# Patient Record
Sex: Female | Born: 1935 | Race: Black or African American | Hispanic: No | Smoking: Never smoker
Health system: Southern US, Community
[De-identification: ages and names within clinical notes are randomized; demographics above are authoritative.]

## PROBLEM LIST (undated history)

## (undated) DIAGNOSIS — I4891 Unspecified atrial fibrillation: Secondary | ICD-10-CM

## (undated) DIAGNOSIS — N186 End stage renal disease: Secondary | ICD-10-CM

## (undated) DIAGNOSIS — I10 Essential (primary) hypertension: Secondary | ICD-10-CM

## (undated) DIAGNOSIS — N289 Disorder of kidney and ureter, unspecified: Secondary | ICD-10-CM

## (undated) DIAGNOSIS — C801 Malignant (primary) neoplasm, unspecified: Secondary | ICD-10-CM

## (undated) DIAGNOSIS — Z992 Dependence on renal dialysis: Secondary | ICD-10-CM

## (undated) DIAGNOSIS — R7881 Bacteremia: Secondary | ICD-10-CM

## (undated) DIAGNOSIS — R410 Disorientation, unspecified: Secondary | ICD-10-CM

## (undated) DIAGNOSIS — B955 Unspecified streptococcus as the cause of diseases classified elsewhere: Secondary | ICD-10-CM

## (undated) HISTORY — PX: CARDIAC CATHETERIZATION: SHX172

## (undated) HISTORY — PX: COLON SURGERY: SHX602

## (undated) HISTORY — DX: End stage renal disease: N18.6

## (undated) HISTORY — DX: Bacteremia: R78.81

## (undated) HISTORY — DX: Unspecified streptococcus as the cause of diseases classified elsewhere: B95.5

## (undated) HISTORY — DX: Dependence on renal dialysis: Z99.2

## (undated) HISTORY — DX: Disorientation, unspecified: R41.0

---

## 1978-10-12 HISTORY — PX: HAND SURGERY: SHX662

## 2011-03-14 HISTORY — PX: HEMATOMA EVACUATION: SHX5118

## 2011-04-09 ENCOUNTER — Encounter: Payer: Self-pay | Admitting: Cardiovascular Disease

## 2011-04-22 ENCOUNTER — Encounter (HOSPITAL_BASED_OUTPATIENT_CLINIC_OR_DEPARTMENT_OTHER): Payer: Medicare Other | Attending: General Surgery

## 2011-04-22 ENCOUNTER — Ambulatory Visit
Admission: RE | Admit: 2011-04-22 | Discharge: 2011-04-22 | Disposition: A | Discharge status: Home / Self Care | Payer: Medicare Other | Source: Ambulatory Visit | Attending: Nephrology | Admitting: Nephrology

## 2011-04-22 ENCOUNTER — Other Ambulatory Visit: Payer: Self-pay | Admitting: Nephrology

## 2011-04-22 DIAGNOSIS — M7989 Other specified soft tissue disorders: Secondary | ICD-10-CM | POA: Insufficient documentation

## 2011-04-22 DIAGNOSIS — R0902 Hypoxemia: Secondary | ICD-10-CM

## 2011-04-22 DIAGNOSIS — R0602 Shortness of breath: Secondary | ICD-10-CM

## 2011-04-22 DIAGNOSIS — M109 Gout, unspecified: Secondary | ICD-10-CM | POA: Insufficient documentation

## 2011-04-22 DIAGNOSIS — I10 Essential (primary) hypertension: Secondary | ICD-10-CM | POA: Insufficient documentation

## 2011-04-22 DIAGNOSIS — L989 Disorder of the skin and subcutaneous tissue, unspecified: Secondary | ICD-10-CM | POA: Insufficient documentation

## 2011-04-22 DIAGNOSIS — E119 Type 2 diabetes mellitus without complications: Secondary | ICD-10-CM | POA: Insufficient documentation

## 2011-04-22 DIAGNOSIS — Z7901 Long term (current) use of anticoagulants: Secondary | ICD-10-CM | POA: Insufficient documentation

## 2011-04-22 DIAGNOSIS — Z79899 Other long term (current) drug therapy: Secondary | ICD-10-CM | POA: Insufficient documentation

## 2011-04-22 DIAGNOSIS — I129 Hypertensive chronic kidney disease with stage 1 through stage 4 chronic kidney disease, or unspecified chronic kidney disease: Secondary | ICD-10-CM | POA: Insufficient documentation

## 2011-04-22 DIAGNOSIS — I4891 Unspecified atrial fibrillation: Secondary | ICD-10-CM | POA: Insufficient documentation

## 2011-04-22 DIAGNOSIS — N189 Chronic kidney disease, unspecified: Secondary | ICD-10-CM | POA: Insufficient documentation

## 2011-04-22 LAB — GLUCOSE, CAPILLARY: Glucose-Capillary: 198 mg/dL — ABNORMAL HIGH (ref 70–99)

## 2011-04-23 ENCOUNTER — Encounter (HOSPITAL_COMMUNITY): Payer: Self-pay | Admitting: Cardiology

## 2011-04-23 ENCOUNTER — Other Ambulatory Visit: Payer: Self-pay

## 2011-04-23 ENCOUNTER — Inpatient Hospital Stay (HOSPITAL_COMMUNITY)
Admission: EM | Admit: 2011-04-23 | Discharge: 2011-04-27 | DRG: 871 | Disposition: A | Discharge status: Home Health | Payer: Medicare Other | Attending: Family Medicine | Admitting: Family Medicine

## 2011-04-23 ENCOUNTER — Emergency Department (HOSPITAL_COMMUNITY): Payer: Medicare Other

## 2011-04-23 DIAGNOSIS — I4891 Unspecified atrial fibrillation: Secondary | ICD-10-CM | POA: Diagnosis present

## 2011-04-23 DIAGNOSIS — A419 Sepsis, unspecified organism: Secondary | ICD-10-CM | POA: Diagnosis present

## 2011-04-23 DIAGNOSIS — E8809 Other disorders of plasma-protein metabolism, not elsewhere classified: Secondary | ICD-10-CM | POA: Diagnosis present

## 2011-04-23 DIAGNOSIS — N186 End stage renal disease: Secondary | ICD-10-CM | POA: Diagnosis present

## 2011-04-23 DIAGNOSIS — I10 Essential (primary) hypertension: Secondary | ICD-10-CM | POA: Diagnosis present

## 2011-04-23 DIAGNOSIS — I120 Hypertensive chronic kidney disease with stage 5 chronic kidney disease or end stage renal disease: Secondary | ICD-10-CM | POA: Diagnosis present

## 2011-04-23 DIAGNOSIS — E119 Type 2 diabetes mellitus without complications: Secondary | ICD-10-CM | POA: Diagnosis present

## 2011-04-23 DIAGNOSIS — Z7901 Long term (current) use of anticoagulants: Secondary | ICD-10-CM

## 2011-04-23 DIAGNOSIS — S81801A Unspecified open wound, right lower leg, initial encounter: Secondary | ICD-10-CM | POA: Diagnosis present

## 2011-04-23 DIAGNOSIS — R7881 Bacteremia: Secondary | ICD-10-CM

## 2011-04-23 DIAGNOSIS — Z66 Do not resuscitate: Secondary | ICD-10-CM | POA: Diagnosis present

## 2011-04-23 DIAGNOSIS — N039 Chronic nephritic syndrome with unspecified morphologic changes: Secondary | ICD-10-CM | POA: Diagnosis present

## 2011-04-23 DIAGNOSIS — F0390 Unspecified dementia without behavioral disturbance: Secondary | ICD-10-CM | POA: Diagnosis present

## 2011-04-23 DIAGNOSIS — I079 Rheumatic tricuspid valve disease, unspecified: Secondary | ICD-10-CM | POA: Diagnosis present

## 2011-04-23 DIAGNOSIS — A409 Streptococcal sepsis, unspecified: Principal | ICD-10-CM | POA: Diagnosis present

## 2011-04-23 DIAGNOSIS — Z888 Allergy status to other drugs, medicaments and biological substances status: Secondary | ICD-10-CM

## 2011-04-23 DIAGNOSIS — Z992 Dependence on renal dialysis: Secondary | ICD-10-CM

## 2011-04-23 DIAGNOSIS — E871 Hypo-osmolality and hyponatremia: Secondary | ICD-10-CM | POA: Diagnosis present

## 2011-04-23 DIAGNOSIS — L02419 Cutaneous abscess of limb, unspecified: Secondary | ICD-10-CM | POA: Diagnosis present

## 2011-04-23 DIAGNOSIS — Z79899 Other long term (current) drug therapy: Secondary | ICD-10-CM

## 2011-04-23 DIAGNOSIS — B955 Unspecified streptococcus as the cause of diseases classified elsewhere: Secondary | ICD-10-CM | POA: Diagnosis present

## 2011-04-23 DIAGNOSIS — D631 Anemia in chronic kidney disease: Secondary | ICD-10-CM | POA: Diagnosis present

## 2011-04-23 DIAGNOSIS — R4182 Altered mental status, unspecified: Secondary | ICD-10-CM

## 2011-04-23 DIAGNOSIS — I071 Rheumatic tricuspid insufficiency: Secondary | ICD-10-CM | POA: Diagnosis present

## 2011-04-23 DIAGNOSIS — D696 Thrombocytopenia, unspecified: Secondary | ICD-10-CM | POA: Diagnosis present

## 2011-04-23 HISTORY — DX: Malignant (primary) neoplasm, unspecified: C80.1

## 2011-04-23 HISTORY — DX: Unspecified atrial fibrillation: I48.91

## 2011-04-23 HISTORY — DX: Disorder of kidney and ureter, unspecified: N28.9

## 2011-04-23 HISTORY — DX: Essential (primary) hypertension: I10

## 2011-04-23 LAB — DIFFERENTIAL
Basophils Absolute: 0 10*3/uL (ref 0.0–0.1)
Eosinophils Relative: 0 % (ref 0–5)
Lymphocytes Relative: 6 % — ABNORMAL LOW (ref 12–46)
Monocytes Relative: 8 % (ref 3–12)
Neutro Abs: 9.6 10*3/uL — ABNORMAL HIGH (ref 1.7–7.7)

## 2011-04-23 LAB — URINE MICROSCOPIC-ADD ON

## 2011-04-23 LAB — CBC
HCT: 32.9 % — ABNORMAL LOW (ref 36.0–46.0)
RBC: 4.39 MIL/uL (ref 3.87–5.11)
RDW: 18.2 % — ABNORMAL HIGH (ref 11.5–15.5)
WBC: 11.2 10*3/uL — ABNORMAL HIGH (ref 4.0–10.5)

## 2011-04-23 LAB — COMPREHENSIVE METABOLIC PANEL
ALT: 22 U/L (ref 0–35)
Albumin: 2.9 g/dL — ABNORMAL LOW (ref 3.5–5.2)
Alkaline Phosphatase: 70 U/L (ref 39–117)
Chloride: 95 mEq/L — ABNORMAL LOW (ref 96–112)
Glucose, Bld: 116 mg/dL — ABNORMAL HIGH (ref 70–99)
Potassium: 5.5 mEq/L — ABNORMAL HIGH (ref 3.5–5.1)
Sodium: 133 mEq/L — ABNORMAL LOW (ref 135–145)
Total Protein: 8.9 g/dL — ABNORMAL HIGH (ref 6.0–8.3)

## 2011-04-23 LAB — URINALYSIS, ROUTINE W REFLEX MICROSCOPIC
Glucose, UA: NEGATIVE mg/dL
Hgb urine dipstick: NEGATIVE
Specific Gravity, Urine: 1.016 (ref 1.005–1.030)

## 2011-04-23 LAB — CARDIAC PANEL(CRET KIN+CKTOT+MB+TROPI)
Relative Index: INVALID (ref 0.0–2.5)
Total CK: 34 U/L (ref 7–177)

## 2011-04-23 MED ORDER — SODIUM CHLORIDE 0.9 % IV BOLUS (SEPSIS)
150.0000 mL | Freq: Once | INTRAVENOUS | Status: AC
  Administered 2011-04-23: 150 mL via INTRAVENOUS

## 2011-04-23 MED ORDER — METOPROLOL TARTRATE 1 MG/ML IV SOLN
INTRAVENOUS | Status: AC
  Administered 2011-04-23: 5 mg
  Filled 2011-04-23: qty 5

## 2011-04-23 MED ORDER — ACETAMINOPHEN 650 MG RE SUPP
650.0000 mg | Freq: Once | RECTAL | Status: AC
  Administered 2011-04-23: 650 mg via RECTAL
  Filled 2011-04-23: qty 1

## 2011-04-23 MED ORDER — PIPERACILLIN-TAZOBACTAM IN DEX 2-0.25 GM/50ML IV SOLN
2.2500 g | INTRAVENOUS | Status: AC
  Administered 2011-04-23: 2.25 g via INTRAVENOUS
  Filled 2011-04-23: qty 50

## 2011-04-23 MED ORDER — VANCOMYCIN HCL 10 G IV SOLR
1.0000 g | INTRAVENOUS | Status: DC
Start: 2011-04-23 — End: 2011-04-23
  Filled 2011-04-23: qty 1000

## 2011-04-23 MED ORDER — SODIUM CHLORIDE 0.9 % IV SOLN
INTRAVENOUS | Status: DC
  Administered 2011-04-23 – 2011-04-24 (×2): via INTRAVENOUS

## 2011-04-23 MED ORDER — VANCOMYCIN HCL IN DEXTROSE 1-5 GM/200ML-% IV SOLN
1000.0000 mg | INTRAVENOUS | Status: AC
  Administered 2011-04-23: 1000 mg via INTRAVENOUS
  Filled 2011-04-23: qty 200

## 2011-04-23 MED ORDER — SODIUM CHLORIDE 0.9 % IV BOLUS (SEPSIS)
250.0000 mL | Freq: Once | INTRAVENOUS | Status: AC
  Administered 2011-04-23: 250 mL via INTRAVENOUS

## 2011-04-23 NOTE — ED Notes (Addendum)
Family stated that pt was at dialysis when she begin to have Altered LOC. According to family she was fully alert and oriented prior to dialysis. Currently, pt is not in any respiratory distress. Will continue to monitor.

## 2011-04-23 NOTE — ED Notes (Signed)
Nursing Communication: [phone] Eldridge Dace)

## 2011-04-23 NOTE — ED Notes (Signed)
Pt to department via EMS from dialysis center. Reports that she was able to sit through 2 hours of dialysis and became altered and tachycardiac. Pt was left accessed and transported here via EMS. Pt given 5mg  lopressor in route, pt noted to still be tachycardiac at this time. Pt unable to answer questions or follow simple commands. Pt confused and slightly combative. PEERLA intact. Pt noted to be febrile when checked. Pt also has a wound vac to the right leg that is in place and functioning. Dr. Golda Acre at the bedside to perform EJ for pt access. Fluids started and pt placed on cardiac monitor.

## 2011-04-23 NOTE — ED Notes (Signed)
Pt to department via EMS from dialysis treatment- reports that while pt was receiving dialysis today that she increased her HR to 150 and became altered during the treatment. Pt noted to still be in A-fib with RVR. 5mg  lopressor given in route. Dialysis shunt still accessed at this time. Bp-160/90 palp.

## 2011-04-23 NOTE — ED Provider Notes (Addendum)
History     CSN: [id number]  Arrival date & time 04/23/11  1528   First MD Initiated Contact with Patient 04/23/11 1537      Chief Complaint  Patient presents with  . Chest Pain    (Consider location/radiation/quality/duration/timing/severity/associated sxs/prior treatment) HPI Comments: Patient presents today from dialysis with altered mental status.  There is a level V caveat because patient is altered and unable to give further history at this time.  Per EMS patient was at dialysis for approximately 2 and half hours and then became altered and was noted to have an elevated heart rate.  Per her medical records patient does have a history of atrial fibrillation and is on Coumadin.  She was transported here via EMS and EMS had received a verbal order per someone else to give 5 mg of Lopressor for her A. fib.  Patient is a 78 y.o. female presenting with chest pain. The history is provided by the EMS personnel. No language interpreter was used.  Chest Pain The chest pain began less than 1 hour ago. Chest pain occurs constantly. The chest pain is unchanged.     Past Medical History  Diagnosis Date  . Renal disorder   . Atrial fibrillation   . Hypertension   . Diabetes mellitus   . Gout   . Cancer     colon    Past Surgical History  Procedure Date  . Cardiac catheterization     History reviewed. No pertinent family history.  History  Substance Use Topics  . Smoking status: Not on file  . Smokeless tobacco: Not on file  . Alcohol Use:     OB History    Grav Para Term Preterm Abortions TAB SAB Ect Mult Living                  Review of Systems  Unable to perform ROS Cardiovascular: Positive for chest pain.    Allergies  Avandia; Clonidine derivatives; Hctz; Lactose intolerance (gi); Lisinopril; and Tekturna  Home Medications   Current Outpatient Rx  Name Route Sig Dispense Refill  . DILTIAZEM HCL ER COATED BEADS 240 MG PO CP24 Oral Take 240 mg by mouth at  bedtime.    Marland Kitchen HYDRALAZINE HCL 25 MG PO TABS Oral Take 25 mg by mouth 3 (three) times daily. Hold morning dose on HD days    . ADULT MULTIVITAMIN W/MINERALS CH Oral Take 1 tablet by mouth daily.    . NEBIVOLOL HCL 20 MG PO TABS Oral Take 1 tablet by mouth at bedtime.    Marland Kitchen PROMETHAZINE HCL 12.5 MG PO TABS Oral Take 12.5 mg by mouth every 12 (twelve) hours as needed. For nausea    . TRAMADOL HCL 50 MG PO TABS Oral Take 50 mg by mouth every 6 (six) hours as needed. For pain    . WARFARIN SODIUM 2.5 MG PO TABS Oral Take 2.5 mg by mouth every Monday, Wednesday, and Friday.    . WARFARIN SODIUM 4 MG PO TABS Oral Take 4 mg by mouth See admin instructions. Take 4 mg on Sunday, Tuesday, Thursday, and Saturday      BP 153/91  Pulse 136  Temp(Src) 102.9 F (39.4 C) (Axillary)  Resp 20  SpO2 100%  Physical Exam  Nursing note and vitals reviewed. Constitutional: She appears well-developed and well-nourished.  Non-toxic appearance. She does not have a sickly appearance.  HENT:  Head: Normocephalic and atraumatic.  Eyes: Conjunctivae, EOM and lids are normal. Pupils are  equal, round, and reactive to light. No scleral icterus.  Neck: Trachea normal and normal range of motion. Neck supple.  Cardiovascular: Normal heart sounds.  An irregularly irregular rhythm present. Tachycardia present.  Exam reveals no gallop and no friction rub.   No murmur heard. Pulmonary/Chest: Effort normal and breath sounds normal. No respiratory distress. She has no wheezes. She has no rales.  Abdominal: Soft. Normal appearance. There is no tenderness. There is no rebound, no guarding and no CVA tenderness.  Musculoskeletal: Normal range of motion.       Patient's left AV fistula is still accessed and there is a palpable thrill.  No bleeding at the site.  Neurological: She is alert. She has normal strength.  Skin: Skin is warm, dry and intact. No rash noted.  Psychiatric:       Unable to assess due to patient's altered  mental status    ED Course  Angiocath insertion Date/Time: 04/23/2011 4:09 PM Performed by: Emeline General A Authorized by: Emeline General A Consent: Verbal consent not obtained. Written consent not obtained. The procedure was performed in an emergent situation. Preparation: Patient was prepped and draped in the usual sterile fashion. Local anesthesia used: no Patient sedated: no Patient tolerance: Patient tolerated the procedure well with no immediate complications. Comments: Patient had an 18-gauge right external jugular IV line placed by myself due to need for emergent IV access on this patient with abnormal vital signs.  Patient was altered and unable to give consent but the line was placed due to the emergency status.  Venous blood returned with ease that was nonpulsatile.  Patient tolerated procedure well.  No complications.   (including critical care time)   Labs Reviewed  CULTURE, BLOOD (ROUTINE X 2)  CULTURE, BLOOD (ROUTINE X 2)  CBC  DIFFERENTIAL  COMPREHENSIVE METABOLIC PANEL  LACTIC ACID, PLASMA  PROCALCITONIN  URINALYSIS, ROUTINE W REFLEX MICROSCOPIC  URINE CULTURE  PROTIME-INR  CARDIAC PANEL(CRET KIN+CKTOT+MB+TROPI)   Dg Chest 2 View  04/22/2011  *RADIOLOGY REPORT*  Clinical Data: History of recent pneumonia, cough, dialysis patient  CHEST - 2 VIEW  Comparison: None.  Findings: There is moderate cardiomegaly present.  Pericardial effusion cannot be excluded.  There do appear to be tiny pleural effusions present which may indicate a degree of failure. Mild volume loss is noted at the right lung base.  No focal infiltrate is seen. The bones are osteopenic and the descending thoracic aorta is ectatic.  IMPRESSION: Moderate cardiomegaly.  Cannot exclude pericardial effusion.  Also question mild CHF with probable small effusions.  Original Report Authenticated By: Juline Patch, M.D.     No diagnosis found.    Date: 04/23/2011  Rate: 141  Rhythm: atrial  fibrillation and with RVR  QRS Axis: left  Intervals: normal  ST/T Wave abnormalities: normal  Conduction Disutrbances:left anterior fascicular block  Narrative Interpretation:   Old EKG Reviewed: None available for comparison but pt has known h/o a fib    MDM  Patient appears to be febrile and there is concern for possible sepsis at this time.  Patient will have appropriate workup started to attempt to expedite any specific cause for treatment.  Patient does appear to be in A. fib with RVR on the monitor but as the patient is febrile and may be infected I am not going to give diltiazem at this point in time so we have more information.  Patient does have normal to hypertensive blood pressures at this time.  Nat Christen, MD 04/23/11 [phone]  Patient with fever and concern for infection.  Her chest x-ray is a poor chest x-ray due to patient's inability this straight up.  Given the patient also could have line sepsis and when to start the patient on vancomycin and Zosyn as she would be a healthcare associated infection due to being a hemodialysis patient.  Vancomycin and Zosyn would  also cover for possible respiratory pathology such as healthcare associated pneumonia.  Nat Christen, MD 04/23/11 1703  Pt still tachycardic but not hypotensive.  I have not treated the patient's A. fib with RVR as her tachycardia is likely related to her infection at this point in time.  I will continue to give the patient small boluses of fluids.  I discussed this patient with Dr. Felipa Eth from pulmonary critical care and given the patient's normal blood pressures feels the patient would be appropriate for a medical step down bed at this time particularly given the patient's normal lactate.  I will call the hospitalist to admit this patient to a medical step down level.  CRITICAL CARE Performed by: Emeline General A   Total critical care time: 45 minutes  Critical care time was exclusive of  separately billable procedures and treating other patients.  Critical care was necessary to treat or prevent imminent or life-threatening deterioration.  Critical care was time spent personally by me on the following activities: development of treatment plan with patient and/or surrogate as well as nursing, discussions with consultants, evaluation of patient's response to treatment, examination of patient, obtaining history from patient or surrogate, ordering and performing treatments and interventions, ordering and review of laboratory studies, ordering and review of radiographic studies, pulse oximetry and re-evaluation of patient's condition.   Nat Christen, MD 04/23/11 1903  Pt discussed with FP resident for admission to med step unit.   Nat Christen, MD 04/23/11 [phone]

## 2011-04-23 NOTE — ED Notes (Signed)
Pt has right leg wound vac from hematoma that occurred in January 2013

## 2011-04-23 NOTE — ED Notes (Signed)
Dopplers will not be completed until tomorrow. Currently, pt is awaiting a stepdown bed. No bed available at this time. Will continue to monitor.

## 2011-04-23 NOTE — Progress Notes (Signed)
ANTIBIOTIC CONSULT NOTE - FOLLOW UP  Pharmacy Consult for Vancomycin,Zosyn and Coumadin Indication: cellulitis-right leg and afib  Allergies  Allergen Reactions  . Avandia (Rosiglitazone Maleate)   . Clonidine Derivatives Other (See Comments)    unknown  . Hctz (Hydrochlorothiazide) Other (See Comments)    unknown  . Lactose Intolerance (Gi)   . Lisinopril Other (See Comments)    unknown  . Tekturna (Aliskiren Fumarate) Other (See Comments)    unknown    Patient Measurements: Height: 5\' 2"  (157.5 cm) Weight: 130 lb (58.968 kg) IBW/kg (Calculated) : 50.1    Vital Signs: Temp: 101.9 F (38.8 C) (03/13 1859) Temp src: Rectal (03/13 1859) BP: 128/72 mmHg (03/13 1930) Pulse Rate: 129  (03/13 1930) Intake/Output from previous day:   Intake/Output from this shift:    Labs:  Basename 04/23/11 1605  WBC 11.2*  HGB 11.4*  PLT 153  LABCREA --  CREATININE 2.68*   Estimated Creatinine Clearance: 11.7 ml/min (by C-G formula based on Cr of 2.68). No results found for this basename: VANCOTROUGH:2,VANCOPEAK:2,VANCORANDOM:2,GENTTROUGH:2,GENTPEAK:2,GENTRANDOM:2,TOBRATROUGH:2,TOBRAPEAK:2,TOBRARND:2,AMIKACINPEAK:2,AMIKACINTROU:2,AMIKACIN:2, in the last 72 hours   Microbiology: No results found for this or any previous visit (from the past 720 hour(s)).  Anti-infectives     Start     Dose/Rate Route Frequency Ordered Stop   04/23/11 1730   vancomycin (VANCOCIN) injection 1 g  Status:  Discontinued        1 g Intravenous To [facility] 04/23/11 1702 04/23/11 1720   04/23/11 1730  piperacillin-tazobactam (ZOSYN) IVPB 2.25 g       2.25 g 100 mL/hr over 30 Minutes Intravenous To [facility] 04/23/11 1702 04/23/11 1907   04/23/11 1730   vancomycin (VANCOCIN) IVPB 1000 mg/200 mL premix        1,000 mg 200 mL/hr over 60 Minutes Intravenous To [facility] 04/23/11 1720 04/23/11 1827          Assessment: 78 yr old female on Vancomycin and Zosyn for  cellulitis of the right leg.  Patient received Vancomycin 1gm IV and Zosyn 2.25gm IV in ED. Patient also on coumadin for afib, INR is 4.12, above goal.  Goal of Therapy:  Vancomycin trough level 10-15 mcg/ml INR=2-3  Plan:  Start Vancomycin 500mg  IV q48hrs-next dose on 3/15, and start Zosyn 2.25gm IV q6hrs. No coumadin tonight as INR above goal. Daily PT/INR. F/u cultures, renal func, daily PT/INR, vanc trough levels if applicable.  Wendie Simmer, PharmD, BCPS Clinical Pharmacist  Pager: [phone]    04/23/2011,8:31 PM

## 2011-04-23 NOTE — ED Notes (Signed)
Admitting MD at bedside.

## 2011-04-23 NOTE — Progress Notes (Signed)
[facility]  NAME:  Miranda Cook, Miranda Cook                ACCOUNT NO.:  [id number]  MEDICAL RECORD NO.:  [id number]      DATE OF BIRTH:  January 30, 1923  PHYSICIAN:  Joanne Gavel, M.D.              VISIT DATE:                                  OFFICE VISIT   CHIEF COMPLAINT:  Wound, right foreleg.  HISTORY OF PRESENT ILLNESS:  A 78 year old female diabetic on chronic hemodialysis for several years, fell several months ago, developed a sizable hematoma.  When the hematoma was cleared, the patient had a large wound on the right foreleg.  PAST MEDICAL HISTORY:  Significant for chronic renal failure on dialysis for 2 years, diabetes mellitus for many years, hypertension, gout, chronic atrial fibrillation.  PAST SURGICAL HISTORY:  Hemicolectomy, colon cancer, hemorrhoidectomy, cardiac cath, the results of which are not available, AV fistula, left upper extremity, and right leg wound.  SOCIAL HISTORY:  Cigarettes:  None.  Alcohol:  None.  MEDICATIONS:  Bystolic, Klor-Con, Dialyvite, Cardizem, Coumadin, Ultram, hydralazine, Sensipar.  ALLERGIES:  CLONIDINE, LISINOPRIL, HCTZ, AVANDIA, TEKTURNA, AND LACTOSE INTOLERANCE.  REVIEW OF SYSTEMS:  The patient has been treated with the [facility] quite successfully.  PHYSICAL EXAMINATION:  VITAL SIGNS:  Temperature 97.3, pulse 93 and irregular, respirations 18, blood pressure 100/63. GENERAL APPEARANCE:  Well-developed, slender, slightly confused elderly female, in no distress. CHEST:  Clear. HEART:  Irregularly irregular. ABDOMEN:  Not examined. EXTREMITIES:  Examination of right lower extremity reveals a 7.8 x 1.4 very superficial wound in the right anterior calf.  Peripheral pulses are palpable.  There is swelling of the foot and some tenderness.  The swelling is essentially bilateral and symmetrical.  IMPRESSION:  Traumatic wound in a patient with diabetes and on hemodialysis and appears to be healing very nicely at present.  PLAN  OF TREATMENT:  We will add collagen to the [facility] treatment.  We will see her in 7 days.     Joanne Gavel, M.D.     RA/MEDQ  D:  04/22/2011  T:  04/23/2011  Job:  [id number]

## 2011-04-23 NOTE — ED Notes (Signed)
Spoke with Consulting civil engineer and EDP. Both made aware that we are still waiting for a stepdown bed and may take awhile. Per Consulting civil engineer, pt will stay in current and not go to holding because there are not drs in the holding area. Will continue to monitor.

## 2011-04-23 NOTE — H&P (Signed)
Family Medicine Teaching [facility] Admission History and Physical  Patient name: Clella Mckeel Medical record number: [id number] Date of birth: [birth year] Age: 78 y.o. Gender: female  Primary Care Provider: Almedia Balls, MD, MD  Chief Complaint: Altered mental status History of Present Illness: Karlye Ihrig is a 78 y.o. female presenting with altered mental status that started last Friday. When after dialysis they informed to granddaughter that they took more fluid. She was fatigued at that point and seemed, nauseated took Promethazine and after that pt had episodes of watery diarrhea. Pt was slowly improving to her almost baseline on Tuesday and today had a rapid decline of her mental condition prior dialysis. Was taken for hemodialysis but after 2 hours was decided to send her to ED to evaluate her AMS and tachycardia. There has been sick contacts (family members) with stomach flu. On ED pt was tachycardic and febrile up to 103. She was never hypotensive.   Pt originally form [place] in dialysis for 2 years. Pt had a hx of a fall back in January with hematoma in Right leg that was drained ( at [facility]) and has been with wound vac for treatment since then. She moved recently (3 weeks ago) with her granddaughter. Goes to [facility] for dialysis, scheduled M//W/F. And has had just 1 appointment with a primary care doctor at [facility] more focused in her cardiac condition.  Past Medical History: Past Medical History  Diagnosis Date  . Renal disorder   . Atrial fibrillation   . Hypertension   . Diabetes mellitus   . Gout   . Cancer     colon   Past Surgical History: Past Surgical History  Procedure Date  . Cardiac catheterization        Colon polyps.   Social History: History   Social History  . Marital Status: Widowed    Spouse Name: N/A    Number of Children: N/A  . Years of Education: N/A   Social History Main Topics  . Smoking status: None  .  Smokeless tobacco: None  . Alcohol Use:   . Drug Use:   . Sexually Active:    Other Topics Concern  . None   Social History Narrative  . None   Family History: History reviewed. No pertinent family history. Allergies: Allergies  Allergen Reactions  . Avandia (Rosiglitazone Maleate)   . Clonidine Derivatives Other (See Comments)    unknown  . Hctz (Hydrochlorothiazide) Other (See Comments)    unknown  . Lactose Intolerance (Gi)   . Lisinopril Other (See Comments)    unknown  . Tekturna (Aliskiren Fumarate) Other (See Comments)    unknown   Current Facility-Administered Medications  Medication Dose Route Frequency Provider Last Rate Last Dose  . acetaminophen (TYLENOL) suppository 650 mg  650 mg Rectal Once Nat Christen, MD   650 mg at 04/23/11 1726  . metoprolol (LOPRESSOR) 1 MG/ML injection        5 mg at 04/23/11 1528  . piperacillin-tazobactam (ZOSYN) IVPB 2.25 g  2.25 g Intravenous To Major Nat Christen, MD   2.25 g at 04/23/11 1837  . sodium chloride 0.9 % bolus 250 mL  250 mL Intravenous Once Nat Christen, MD   250 mL at 04/23/11 1614  . sodium chloride 0.9 % bolus 250 mL  250 mL Intravenous Once Nat Christen, MD   250 mL at 04/23/11 1918  . vancomycin (VANCOCIN) IVPB 1000 mg/200 mL premix  1,000  mg Intravenous To Major Nat Christen, MD   1,000 mg at 04/23/11 1727  . DISCONTD: vancomycin (VANCOCIN) injection 1 g  1 g Intravenous To Major Nat Christen, MD       Current Outpatient Prescriptions  Medication Sig Dispense Refill  . diltiazem (CARDIZEM CD) 240 MG 24 hr capsule Take 240 mg by mouth at bedtime.      . hydrALAZINE (APRESOLINE) 25 MG tablet Take 25 mg by mouth 3 (three) times daily. Hold morning dose on HD days      . Multiple Vitamin (MULITIVITAMIN WITH MINERALS) TABS Take 1 tablet by mouth daily.      . Nebivolol HCl (BYSTOLIC) 20 MG TABS Take 1 tablet by mouth at bedtime.      . promethazine (PHENERGAN) 12.5 MG tablet Take 12.5  mg by mouth every 12 (twelve) hours as needed. For nausea      . traMADol (ULTRAM) 50 MG tablet Take 50 mg by mouth every 6 (six) hours as needed. For pain      . warfarin (COUMADIN) 2.5 MG tablet Take 2.5 mg by mouth every Monday, Wednesday, and Friday.      . warfarin (COUMADIN) 4 MG tablet Take 4 mg by mouth See admin instructions. Take 4 mg on Sunday, Tuesday, Thursday, and Saturday        Review Of Systems: Per HPI Review of Systems  Constitutional: Positive for fever. Negative for malaise/fatigue.  Eyes: Negative.   Respiratory: Negative for cough, sputum production, shortness of breath and wheezing.   Cardiovascular: Negative for chest pain, palpitations and orthopnea.  Gastrointestinal: Positive for nausea and diarrhea.  Genitourinary: Negative for dysuria.  Neurological: Positive for loss of consciousness. Negative for focal weakness, seizures and headaches.  Psychiatric/Behavioral:       Baseline mild dementia.    Physical Exam: Filed Vitals:   04/23/11 2033  BP: 128/72  Pulse: 129  Temp: 99 F (37.2 C)  Resp: 13   Gen:  NAD HEENT: mildly dry mucous membranes, no JVD CV: Tachycardic, systolic murmurs present.  No rubs. PULM: Clear to auscultation bilaterally. No wheezes/rales/rhonchi ABD: Soft, non tender, non distended, normal bowel sounds EXT: Right Knee with wound vac in place mild erythema above the knee and painful to palpation. Not visualized above the knee due to pt habitus. Brisk bilateral pedal pulses. 1+bilateral pitting edema of LE. Neuro: lethargic, responding only to name. No tremors. Pt does not cooperate for rest of  neurologic physical exam.  Labs and Imaging: Lab Results  Component Value Date/Time   NA 133* 04/23/2011  4:05 PM   K 5.5* 04/23/2011  4:05 PM   CL 95* 04/23/2011  4:05 PM   CO2 26 04/23/2011  4:05 PM   BUN 15 04/23/2011  4:05 PM   CREATININE 2.68* 04/23/2011  4:05 PM   GLUCOSE 116* 04/23/2011  4:05 PM   Lab Results  Component Value Date     WBC 11.2* 04/23/2011   HGB 11.4* 04/23/2011   HCT 32.9* 04/23/2011   MCV 74.9* 04/23/2011   PLT 153 04/23/2011    Assessment and Plan: Renal disorder   Atrial fibrillation   Hypertension   Diabetes mellitus   Gout   Cancer    colon   Rutha Melgoza is a 78 y.o.  female with Hx of Afib on coumadin and CKD on dialysis presenting with AMS since last Friday.  1. AMS: Lethargic, not CTA Chronic ischemic changes and atrophy. No acute intracranial pathology. No sure  the cause but in the differential it is highly considered infection. Pt is febrile, tachycardic, mildly elevation of WBC and the source has not been identified yet being the right leg wound, urine or GI  the most likely sources.  - UA and UCx, Blood Cx - Vanc and Zosyn. - Dopplers of LE.   2. Right Knee wound: - Wound consult  3. CKD: Cr 2.68, K 5.5 and CO2 26 -Slow hydration due to multiple competing cormobilities and  will consult with renal to maintain scheduled Dialysis.   3.Afib: On coumadin. INR: 4.12. Will hold coumadin for now. -continue Cardizem to a lower dose since at this point BP is important to monitor.   3. HTN: hold bistolic for now and monitor.  4. DM : SSI and A1C.  5. Dementia: Mild, not current treatment. Pt functional before this event.  FEN/GI: NS 50 ml/h per 10 hours. Prophylaxis: INR elevated.  Disposition: pending improvement.  D. [place] Rolene Arbour PGY-1 FMTS Pager [phone]     R3 Addendum Please see above for more thorough H&P note by intern In brief   Maciel Kegg is a 78 y.o. Female with Hx of DM, ESRD on HD, AFib on coumadin, dementia  With recent hospitalization for fall in January followed by a 2 week stay in inpt rehab in [place] who recently moved to [place] in the last 3 weeks to live with Granddaughter who presenting with altered mental status that started last Friday. Over the coarse of the weekend pt presented with diarrhea, fatigue and worsening AMS per POA  report.  Seemed to be improving on Tuesday but today was much worse. . Was taken for hemodialysis but after 2 hours was decided to send her to ED to evaluate her AMS and tachycardia. There has been sick contacts (family members) with stomach flu. On ED pt was tachycardic and febrile up to 103. She was never hypotensive.      Pt had a hx of a fall back in January with hematoma in Right leg that was drained ( at [facility]) and has been with wound vac for treatment since then. She moved recently (3 weeks ago) with her granddaughter. Goes to [facility] for dialysis, scheduled M//W/F. And has had just 1 appointment with a primary care doctor at [facility] more focused in her cardiac condition. Please see intern note for PmHx, PSH, SH, medications  Physical Exam: Filed Vitals:   04/23/11 2033  BP: 128/72  Pulse: 129  Temp: 99 F (37.2 C)  Resp: 13   Gen:  NAD HEENT: dry mucous membranes, no sores no JVD CV: Tachycardic, systolic murmurs present.  No rubs. Irregular rate and rythym PULM: Clear to auscultation bilaterally. No wheezes/rales/rhonchi ABD: Soft, non tender, non distended, normal bowel sounds EXT: Right Knee with wound vac in place just inferior to knee at the anterior lateral aspect,  erythema  and painful to palpation with trace to +{1 edema. . Not visualized above the knee due to pt habitus. Brisk bilateral pedal pulses.  Neuro: lethargic, responding only to name. No tremors. Pt does not cooperate for rest of  neurologic physical exam.  Yailin Biederman is a 78 y.o.  female with Hx of Afib on coumadin, DM, HTN and ESRD on dialysis presenting with AMS since last Friday.  1. AMS: Lethargic, does have dementia at baseline that family states has some trouble with seeing people who are not there and what sounds to be sundowning.Marland Kitchen No  acute intracranial pathology on CT. High on differential is infection. . Pt is febrile, tachycardic, mildly elevation of WBC and the source  has not been identified yet being the right leg wound,does seem likely source. .  - UA negative  and UCx, Blood Cx pending - Vanc and Zosyn. - Dopplers of LE rule out clot but unlikely  2. Right Knee wound: has wound vac, more of the tibial region.  - Wound consult  3. CKD: Cr 2.68, K 5.5 and CO2 26 -Slow hydration due to multiple competing cormobilities and  will consult with renal to maintain scheduled Dialysis.   3.Afib: On coumadin. INR: 4.12. Will hold coumadin for now. -continue Cardizem to a lower dose since at this point BP is important to monitor.  - ECHO to check for valve dx and if none consider change from coumadin  3. HTN: hold bistolic and hydralazine for now and monitor.  4. DM : SSI and A1C.  5. Dementia: Mild, not current treatment. Pt functional before this event.  FEN/GI: NS 50 ml/h per 10 hours then will see if pt able to tolerate PO. Prophylaxis: INR elevated.  Disposition: pending improvement.

## 2011-04-24 ENCOUNTER — Inpatient Hospital Stay (HOSPITAL_COMMUNITY): Payer: Medicare Other

## 2011-04-24 DIAGNOSIS — A419 Sepsis, unspecified organism: Secondary | ICD-10-CM

## 2011-04-24 DIAGNOSIS — L02419 Cutaneous abscess of limb, unspecified: Secondary | ICD-10-CM

## 2011-04-24 DIAGNOSIS — F05 Delirium due to known physiological condition: Secondary | ICD-10-CM

## 2011-04-24 DIAGNOSIS — L03119 Cellulitis of unspecified part of limb: Secondary | ICD-10-CM

## 2011-04-24 DIAGNOSIS — N186 End stage renal disease: Secondary | ICD-10-CM

## 2011-04-24 LAB — LIPID PANEL
Cholesterol: 99 mg/dL (ref 0–200)
Triglycerides: 92 mg/dL (ref <150)
VLDL: 18 mg/dL (ref 0–40)

## 2011-04-24 LAB — HEMOGLOBIN A1C
Hgb A1c MFr Bld: 5.5 % (ref <5.7)
Mean Plasma Glucose: 111 mg/dL (ref <117)

## 2011-04-24 LAB — GLUCOSE, CAPILLARY
Glucose-Capillary: 166 mg/dL — ABNORMAL HIGH (ref 70–99)
Glucose-Capillary: 202 mg/dL — ABNORMAL HIGH (ref 70–99)

## 2011-04-24 LAB — CBC
HCT: 29.9 % — ABNORMAL LOW (ref 36.0–46.0)
Hemoglobin: 9.9 g/dL — ABNORMAL LOW (ref 12.0–15.0)
MCH: 25.4 pg — ABNORMAL LOW (ref 26.0–34.0)
MCV: 76.9 fL — ABNORMAL LOW (ref 78.0–100.0)
RBC: 3.89 MIL/uL (ref 3.87–5.11)

## 2011-04-24 LAB — CARDIAC PANEL(CRET KIN+CKTOT+MB+TROPI)
CK, MB: 1 ng/mL (ref 0.3–4.0)
CK, MB: 1 ng/mL (ref 0.3–4.0)
Troponin I: <0.3 ng/mL (ref <0.30)
Troponin I: <0.3 ng/mL (ref <0.30)

## 2011-04-24 LAB — URINE CULTURE
Colony Count: NO GROWTH
Culture  Setup Time: 201303140222
Culture: NO GROWTH

## 2011-04-24 LAB — PROTIME-INR: Prothrombin Time: 46.4 seconds — ABNORMAL HIGH (ref 11.6–15.2)

## 2011-04-24 LAB — COMPREHENSIVE METABOLIC PANEL
ALT: 14 U/L (ref 0–35)
BUN: 20 mg/dL (ref 6–23)
CO2: 27 mEq/L (ref 19–32)
Calcium: 8.9 mg/dL (ref 8.4–10.5)
Creatinine, Ser: 3.55 mg/dL — ABNORMAL HIGH (ref 0.50–1.10)
GFR calc Af Amer: 12 mL/min — ABNORMAL LOW (ref >90)
GFR calc non Af Amer: 11 mL/min — ABNORMAL LOW (ref >90)
Glucose, Bld: 119 mg/dL — ABNORMAL HIGH (ref 70–99)
Sodium: 133 mEq/L — ABNORMAL LOW (ref 135–145)

## 2011-04-24 LAB — MAGNESIUM: Magnesium: 1.8 mg/dL (ref 1.5–2.5)

## 2011-04-24 MED ORDER — HEPARIN SODIUM (PORCINE) 5000 UNIT/ML IJ SOLN
5000.0000 [IU] | Freq: Three times a day (TID) | INTRAMUSCULAR | Status: DC
  Filled 2011-04-24 (×3): qty 1

## 2011-04-24 MED ORDER — DILTIAZEM HCL ER COATED BEADS 120 MG PO CP24
120.0000 mg | ORAL_CAPSULE | Freq: Every day | ORAL | Status: DC
  Filled 2011-04-24: qty 1

## 2011-04-24 MED ORDER — PIPERACILLIN-TAZOBACTAM IN DEX 2-0.25 GM/50ML IV SOLN
2.2500 g | Freq: Three times a day (TID) | INTRAVENOUS | Status: DC
  Administered 2011-04-24 – 2011-04-25 (×4): 2.25 g via INTRAVENOUS
  Filled 2011-04-24 (×6): qty 50

## 2011-04-24 MED ORDER — DILTIAZEM HCL 30 MG PO TABS
30.0000 mg | ORAL_TABLET | Freq: Four times a day (QID) | ORAL | Status: DC
  Administered 2011-04-24 – 2011-04-27 (×10): 30 mg via ORAL
  Filled 2011-04-24 (×14): qty 1

## 2011-04-24 MED ORDER — SODIUM CHLORIDE 0.9 % IV SOLN
750.0000 mg | INTRAVENOUS | Status: DC
  Filled 2011-04-24: qty 750

## 2011-04-24 MED ORDER — ADULT MULTIVITAMIN W/MINERALS CH
1.0000 | ORAL_TABLET | Freq: Every day | ORAL | Status: DC
  Administered 2011-04-24 – 2011-04-27 (×4): 1 via ORAL
  Filled 2011-04-24 (×4): qty 1

## 2011-04-24 MED ORDER — ACETAMINOPHEN 650 MG RE SUPP: 650.0000 mg | Freq: Four times a day (QID) | RECTAL | Status: DC | PRN

## 2011-04-24 MED ORDER — CINACALCET HCL 30 MG PO TABS
30.0000 mg | ORAL_TABLET | ORAL | Status: DC
  Administered 2011-04-25: 30 mg via ORAL
  Filled 2011-04-24 (×2): qty 1

## 2011-04-24 MED ORDER — ACETAMINOPHEN 325 MG PO TABS
650.0000 mg | ORAL_TABLET | Freq: Four times a day (QID) | ORAL | Status: DC | PRN
  Administered 2011-04-25 – 2011-04-27 (×2): 650 mg via ORAL
  Filled 2011-04-24: qty 2

## 2011-04-24 MED ORDER — DILTIAZEM HCL 30 MG PO TABS: 30.0000 mg | ORAL_TABLET | Freq: Four times a day (QID) | ORAL | Status: DC

## 2011-04-24 MED ORDER — ONDANSETRON HCL 4 MG/2ML IJ SOLN: 4.0000 mg | Freq: Four times a day (QID) | INTRAMUSCULAR | Status: DC | PRN

## 2011-04-24 MED ORDER — PANTOPRAZOLE SODIUM 40 MG IV SOLR
40.0000 mg | Freq: Every day | INTRAVENOUS | Status: DC
  Administered 2011-04-24 – 2011-04-26 (×2): 40 mg via INTRAVENOUS
  Filled 2011-04-24 (×3): qty 40

## 2011-04-24 MED ORDER — PARICALCITOL 5 MCG/ML IV SOLN
3.0000 ug | INTRAVENOUS | Status: DC
  Administered 2011-04-25: 3 ug via INTRAVENOUS
  Filled 2011-04-24 (×2): qty 0.6

## 2011-04-24 MED ORDER — ADULT MULTIVITAMIN W/MINERALS CH: 1.0000 | ORAL_TABLET | Freq: Every day | ORAL | Status: DC

## 2011-04-24 MED ORDER — PIPERACILLIN-TAZOBACTAM IN DEX 2-0.25 GM/50ML IV SOLN
2.2500 g | Freq: Four times a day (QID) | INTRAVENOUS | Status: DC
  Administered 2011-04-24: 2.25 g via INTRAVENOUS
  Filled 2011-04-24 (×3): qty 50

## 2011-04-24 MED ORDER — ONDANSETRON HCL 4 MG PO TABS: 4.0000 mg | ORAL_TABLET | Freq: Four times a day (QID) | ORAL | Status: DC | PRN

## 2011-04-24 MED ORDER — WARFARIN - PHARMACIST DOSING INPATIENT: Freq: Every day | Status: DC

## 2011-04-24 MED ORDER — DARBEPOETIN ALFA-POLYSORBATE 60 MCG/0.3ML IJ SOLN
60.0000 ug | INTRAMUSCULAR | Status: DC
  Administered 2011-04-25: 60 ug via INTRAVENOUS
  Filled 2011-04-24: qty 0.3

## 2011-04-24 MED ORDER — VANCOMYCIN HCL 500 MG IV SOLR: 500.0000 mg | INTRAVENOUS | Status: DC

## 2011-04-24 NOTE — Progress Notes (Signed)
Per Darl Pikes in lab Blood cultures aerobic/ anerobic drawn on 3/13 and 2nd aerobic bottle drawn on 3/13 all bottles growing the same thing gram positive cocci in pairs and chains. Dr. Madolyn Frieze. Made aware.

## 2011-04-24 NOTE — Progress Notes (Signed)
Patient's daughter is present and has been able to provide answers to admission questions.  The daughter states that the patient had a fall near the end of January which resulted in need for surgery on right leg due to a hematoma that formed after the fall.  Then the patient was sent to [facility] in [place] for rehab.  The patient then moved in with her daughter in [place]. The daughter states that a home health aide comes to the home multiple times per week to assist the patient with activities of daily living and PT works with the patient on MWF, OT works with the patient on TR.   Timmie Foerster, RN

## 2011-04-24 NOTE — Progress Notes (Signed)
ANTIBIOTIC CONSULT NOTE - FOLLOW UP  Pharmacy Consult for Vancomycin, Zosyn, and Coumadin Indication: cellulitis-right leg and afib  Allergies  Allergen Reactions  . Avandia (Rosiglitazone Maleate)   . Clonidine Derivatives Other (See Comments)    unknown  . Hctz (Hydrochlorothiazide) Other (See Comments)    unknown  . Lactose Intolerance (Gi)   . Lisinopril Other (See Comments)    unknown  . Tekturna (Aliskiren Fumarate) Other (See Comments)    unknown    Patient Measurements: Height: 5\' 3"  (160 cm) Weight: 137 lb 2 oz (62.2 kg) IBW/kg (Calculated) : 52.4    Vital Signs: Temp: 98.2 F (36.8 C) (03/14 0756) Temp src: Axillary (03/14 0756) BP: 99/62 mmHg (03/14 0756) Pulse Rate: 109  (03/14 0756) Intake/Output from previous day: 03/13 0701 - 03/14 0700 In: 336.7 [I.V.:336.7] Out: -  Intake/Output from this shift:    Labs:  Basename 04/24/11 0530 04/23/11 1605  WBC 15.7* 11.2*  HGB 9.9* 11.4*  PLT 142* 153  LABCREA -- --  CREATININE 3.55* 2.68*   Estimated Creatinine Clearance: 9.2 ml/min (by C-G formula based on Cr of 3.55). No results found for this basename: VANCOTROUGH:2,VANCOPEAK:2,VANCORANDOM:2,GENTTROUGH:2,GENTPEAK:2,GENTRANDOM:2,TOBRATROUGH:2,TOBRAPEAK:2,TOBRARND:2,AMIKACINPEAK:2,AMIKACINTROU:2,AMIKACIN:2, in the last 72 hours   Microbiology: Recent Results (from the past 720 hour(s))  MRSA PCR SCREENING     Status: Normal   Collection Time   04/24/11  4:24 AM      Component Value Range Status Comment   MRSA by PCR NEGATIVE  NEGATIVE  Final     Anti-infectives     Start     Dose/Rate Route Frequency Ordered Stop   04/25/11 1800   vancomycin (VANCOCIN) 500 mg in sodium chloride 0.9 % 100 mL IVPB        500 mg 100 mL/hr over 60 Minutes Intravenous Every 48 hours 04/24/11 0418     04/24/11 0600   piperacillin-tazobactam (ZOSYN) IVPB 2.25 g        2.25 g 100 mL/hr over 30 Minutes Intravenous Every 6 hours 04/24/11 0418     04/23/11 1730    vancomycin (VANCOCIN) injection 1 g  Status:  Discontinued        1 g Intravenous To Major Emergency Dept 04/23/11 1702 04/23/11 1720   04/23/11 1730   piperacillin-tazobactam (ZOSYN) IVPB 2.25 g        2.25 g 100 mL/hr over 30 Minutes Intravenous To Major Emergency Dept 04/23/11 1702 04/23/11 1907   04/23/11 1730   vancomycin (VANCOCIN) IVPB 1000 mg/200 mL premix        1,000 mg 200 mL/hr over 60 Minutes Intravenous To Major Emergency Dept 04/23/11 1720 04/23/11 1827          Assessment: RLE cellulitis: On Day #2 of Vancomycin and Zosyn.  She received an adequate Vancomycin loading dose.  Will adjust regimen for HD-dependent ESRD.  History of Afib:  Her INR remains supratherapeutic, likely influenced by acute illness.  She also has a DVT prophylaxis dose of SQ Heparin ordered which is not necessary in the setting of a therapeutic/supratherapeutic INR and may increase her risk of bleeding complications.  Goal of Therapy:  Pre-dialysis Vancomycin level 15-25 mcg/mL INR=2-3  Plan:  Change Vancomycin to 750mg  IV with each HD session. Change Zosyn to 2.25gm IV q8h for HD-dependent ESRD. Follow for HD plans and HD tolerance. No Coumadin today. Daily PT/INR monitoring. D/C SQ Heparin.  Miranda Cook, Pharm.D., BCPS Clinical Pharmacist  Pager [phone] 04/24/2011, 9:19 AM

## 2011-04-24 NOTE — Progress Notes (Signed)
Subjective: Grand-daughter at bedside feels like she is back to her baseline mental status. Has not had any diarrhea, nausea, vomiting. Wound care nurse was  Changing right leg wound vac and did not note any signs of infection.   Objective: Vital signs in last 24 hours: Temp:  [98.2 F (36.8 C)-103 F (39.4 C)] 98.2 F (36.8 C) (03/14 0756) Pulse Rate:  [50-142] 109  (03/14 0756) Resp:  [12-24] 14  (03/14 0756) BP: (99-153)/(61-91) 99/62 mmHg (03/14 0756) SpO2:  [94 %-100 %] 94 % (03/14 0756) Weight:  [130 lb (58.968 kg)-137 lb 2 oz (62.2 kg)] 137 lb 2 oz (62.2 kg) (03/14 0415) Weight change:     Intake/Output from previous day: 03/13 0701 - 03/14 0700 In: 336.7 [I.V.:336.7] Out: -  Intake/Output this shift: Total I/O In: 240 [P.O.:240] Out: -   General appearance: alert and oriented to person, knows she is in the hospital but doesn't where. Thinks it is 62.  Resp: diminished breath sounds, normal work of breathing.  Cardio: s1s2, regular rate and rhythm, no murmur appreciated.  GI: present bowel sounds, soft, non tender, non distended Extremities: bandage in place on right leg, minimal erythema around wound, no warmth, no tenderness Skin: minimal erythema on right leg.  Lab Results:  Basename 04/24/11 0530 04/23/11 1605  WBC 15.7* 11.2*  HGB 9.9* 11.4*  HCT 29.9* 32.9*  PLT 142* 153   BMET  Basename 04/24/11 0530 04/23/11 1605  NA 133* 133*  K 4.5 5.5*  CL 99 95*  CO2 27 26  GLUCOSE 119* 116*  BUN 20 15  CREATININE 3.55* 2.68*  CALCIUM 8.9 9.6    Studies/Results: Dg Chest 2 View  04/22/2011  *RADIOLOGY REPORT*  Clinical Data: History of recent pneumonia, cough, dialysis patient  CHEST - 2 VIEW  Comparison: None.  Findings: There is moderate cardiomegaly present.  Pericardial effusion cannot be excluded.  There do appear to be tiny pleural effusions present which may indicate a degree of failure. Mild volume loss is noted at the right lung base.  No focal  infiltrate is seen. The bones are osteopenic and the descending thoracic aorta is ectatic.  IMPRESSION: Moderate cardiomegaly.  Cannot exclude pericardial effusion.  Also question mild CHF with probable small effusions.  Original Report Authenticated By: Juline Patch, M.D.   Ct Head Wo Contrast  04/23/2011  *RADIOLOGY REPORT*  Clinical Data: Altered mental status  CT HEAD WITHOUT CONTRAST  Technique:  Contiguous axial images were obtained from the base of the skull through the vertex without contrast.  Comparison: None.  Findings: Global atrophy.  Chronic ischemic changes. Encephalomalacia in the right occipital lobe has a chronic appearance.  No mass effect, midline shift, or acute intracranial hemorrhage. Mastoid air cells are clear.  Cranium is intact.  IMPRESSION: Chronic ischemic changes and atrophy.  No acute intracranial pathology.  Original Report Authenticated By: Donavan Burnet, M.D.   Dg Chest Portable 1 View  04/23/2011  *RADIOLOGY REPORT*  Clinical Data: Back pain  PORTABLE CHEST - 1 VIEW  Comparison: Plain film 04/22/2011  Findings: Patient rotated leftward.  Cardiac silhouette is enlarged.  There is increased interstitial edema pattern compared to prior.  Lung bases are poorly evaluated.  IMPRESSION: Cardiomegaly and increased interstitial edema.  Low lung volumes.  Original Report Authenticated By: Genevive Bi, M.D.    Medications:     . acetaminophen  650 mg Rectal Once  . diltiazem  30 mg Oral Q6H  . metoprolol      .  mulitivitamin with minerals  1 tablet Oral Daily  . pantoprazole (PROTONIX) IV  40 mg Intravenous QHS  . piperacillin-tazobactam (ZOSYN)  IV  2.25 g Intravenous To Major  . piperacillin-tazobactam (ZOSYN)  IV  2.25 g Intravenous Q8H  . sodium chloride  150 mL Intravenous Once  . sodium chloride  250 mL Intravenous Once  . sodium chloride  250 mL Intravenous Once  . vancomycin  750 mg Intravenous Q M,W,F-HD  . vancomycin  1,000 mg Intravenous To Major  .  Warfarin - Pharmacist Dosing Inpatient   Does not apply q1800  . DISCONTD: diltiazem  120 mg Oral QHS  . DISCONTD: diltiazem  30 mg Oral Q6H  . DISCONTD: heparin  5,000 Units Subcutaneous Q8H  . DISCONTD: mulitivitamin with minerals  1 tablet Oral Daily  . DISCONTD: piperacillin-tazobactam (ZOSYN)  IV  2.25 g Intravenous Q6H  . DISCONTD: vancomycin  500 mg Intravenous Q48H  . DISCONTD: vancomycin  1 g Intravenous To Major     Assessment/Plan: 78 yo female with h/o diabetes, hypertension,  ESRD (M,W,F dialysis), baseline dementia and atrial fibrilllation on coumadin who presented with delirium, tachycardia and fever in the ED.   1. Infection and sepsis: blood culture was just recently found to grow gram positive cocci in pairs and chains. Unclear where this is coming from given that the right leg wound doesn't appear infected. Will continue vanc and zosyn. With tachycardia, elevated WBC at 15.7 and fever yesterday with a source, patient would qualify as septic. procalcitonin elevated at 22.9.  - obtain 2D echo since there is a report of murmur on pe.  - follow up urine cx - Continue vanc and zosyn - monitor fever curve. Currently afebrile.  - continue wound care  2.Delirium: thought to be secondary to infection. Now appears resolved. She is in the stepdown unit which is most likely going to aggravate delirium. Has family member at bedside. Consider sitter if she significantly sundowns. Will remove restraints for now. 2. Right Knee wound:   3. CKD: Creatinine today: 3.55 compared to 2.68 yesterday. Did not seem to have received full session of dialysis. Will follow up renal recommendations.   -Slow hydration due to multiple competing cormobilities and will consult with renal to maintain scheduled Dialysis.  4.Afib: On coumadin. INR: 4.12. Coumadin held for now.  -continue Cardizem to a lower dose since at this point BP is important to monitor. Consider holding for significant drop in BP.  5.  HTN: hold bistolic for now and monitor.  6. DM : SSI and A1C: 5.5 7. Dementia: Mild, not current treatment. Pt functional before this event.  FEN/GI: NS 50 ml/h  Prophylaxis: INR elevated.  Disposition: remain in step down unit    LOS: 1 day   Laterrica Libman 04/24/2011, 1:01 PM [phone]

## 2011-04-24 NOTE — ED Notes (Signed)
Pt was transported upstairs by Elvera Lennox RN. Restraints were still on pt at this time. Phineas Semen RN was made aware of restraints and ending time.

## 2011-04-24 NOTE — Consult Note (Signed)
[facility] Consult [facility]  Date: 04/24/2011  Patient name: Miranda Cook Medical record number: [id number] Date of birth: 10-Nov-1922 Age: 78 y.o. Gender: female PCP: Almedia Balls, MD, MD  Medical Service: Nephrology      Chief Complaint: Altered Mental Status, Fever  History of Present Illness: Miranda Cook is an 78 year old female with a history of ESRD on hemodialysis, Type 2 diabetes, HTN, Chronic Afib on coumadin, and dementia who presented to the ED yesterday with Altered Mental Status. The pt was brought to the ED by EMS. Per EMS the pt was on dialysis for 2.5 hours when she became altered with an elevation in HR. In the ED she was found to be in AFIB/RVR. She was also noted to be febrile with temps as high as 103. There were concerns for possible sepsis and the pt was placed on Vanc and Zosyn. Urine and Blood cultures were obtained and are pending at this time.   The patient does have a history of dementia at baseline but per reports the pt is normally considered to be functional. No family was present to confirm this information.  The patient is currently living with her granddaughter for the past three weeks. Per reports over the weekend the pt had diarrhea, fatigue and a decrease in mental status. This had improved by Tuesday of this week then declined yesterday when the pt was at dialysis. The patient did have know sick contacts with family members who had the stomach flu.  The pt had a fall in January of this year and developed a hematoma in her right leg. The hematoma was drained and the patient was placed on a wound vac and has remained on it since that time.  Meds: Medications Prior to Admission  Medication Dose Route Frequency Provider Last Rate Last Dose  . 0.9 %  sodium chloride infusion   Intravenous Continuous Judi Saa, DO 50 mL/hr at 04/24/11 1047    . acetaminophen (TYLENOL) suppository 650 mg  650 mg Rectal Once Nat Christen,  MD   650 mg at 04/23/11 1726  . acetaminophen (TYLENOL) tablet 650 mg  650 mg Oral Q6H PRN Judi Saa, DO       Or  . acetaminophen (TYLENOL) suppository 650 mg  650 mg Rectal Q6H PRN Judi Saa, DO      . diltiazem (CARDIZEM CD) 24 hr capsule 120 mg  120 mg Oral QHS Judi Saa, DO      . metoprolol (LOPRESSOR) 1 MG/ML injection        5 mg at 04/23/11 1528  . mulitivitamin with minerals tablet 1 tablet  1 tablet Oral Daily Judi Saa, DO   1 tablet at 04/24/11 1047  . ondansetron (ZOFRAN) tablet 4 mg  4 mg Oral Q6H PRN Judi Saa, DO       Or  . ondansetron [facility]) injection 4 mg  4 mg Intravenous Q6H PRN Judi Saa, DO      . pantoprazole (PROTONIX) injection 40 mg  40 mg Intravenous QHS Judi Saa, DO      . piperacillin-tazobactam (ZOSYN) IVPB 2.25 g  2.25 g Intravenous To Major Nat Christen, MD   2.25 g at 04/23/11 1837  . piperacillin-tazobactam (ZOSYN) IVPB 2.25 g  2.25 g Intravenous Q8H Madolyn Frieze, PHARMD      . sodium chloride 0.9 % bolus 150 mL  150 mL Intravenous Once Judi Saa, DO  150 mL at 04/23/11 2149  . sodium chloride 0.9 % bolus 250 mL  250 mL Intravenous Once Nat Christen, MD   250 mL at 04/23/11 1614  . sodium chloride 0.9 % bolus 250 mL  250 mL Intravenous Once Nat Christen, MD   250 mL at 04/23/11 1918  . vancomycin (VANCOCIN) 750 mg in sodium chloride 0.9 % 150 mL IVPB  750 mg Intravenous Q M,W,F-HD Madolyn Frieze, PHARMD      . vancomycin (VANCOCIN) IVPB 1000 mg/200 mL premix  1,000 mg Intravenous To Major Nat Christen, MD   1,000 mg at 04/23/11 1727  . Warfarin - Pharmacist Dosing Inpatient   Does not apply q1800 Nat Christen, MD      . DISCONTD: heparin injection 5,000 Units  5,000 Units Subcutaneous Q8H Judi Saa, DO      . DISCONTD: mulitivitamin with minerals tablet 1 tablet  1 tablet Oral Daily Judi Saa, DO      . DISCONTD: piperacillin-tazobactam (ZOSYN) IVPB  2.25 g  2.25 g Intravenous Q6H Nat Christen, MD   2.25 g at 04/24/11 4098  . DISCONTD: vancomycin (VANCOCIN) 500 mg in sodium chloride 0.9 % 100 mL IVPB  500 mg Intravenous Q48H Nat Christen, MD      . DISCONTD: vancomycin (VANCOCIN) injection 1 g  1 g Intravenous To Major Nat Christen, MD       No current outpatient prescriptions on file as of 04/24/2011.    Allergies: Avandia; Clonidine derivatives; Hctz; Lactose intolerance (gi); Lisinopril; and Tekturna Past Medical History  Diagnosis Date  . Renal disorder   . Atrial fibrillation   . Hypertension   . Diabetes mellitus   . Gout   . Cancer     colon   Past Surgical History  Procedure Date  . Cardiac catheterization    History reviewed. No pertinent family history. History   Social History  . Marital Status: Widowed    Spouse Name: N/A    Number of Children: N/A  . Years of Education: N/A   Occupational History  . Not on file.   Social History Main Topics  . Smoking status: Not on file  . Smokeless tobacco: Not on file  . Alcohol Use:   . Drug Use:   . Sexually Active:    Other Topics Concern  . Not on file   Social History Narrative  . No narrative on file    Review of Systems: ROS were difficult to obtain due to pts mental status Constitutional: + for fevers, the pt denies chills or night sweats, she denies any fatigue or malaise Resp/CV- the patient denies any chest pain or SOB GI- the pt states she has noted vomiting "a couple of times over the past month" and diarrhea, she denies any abd pain EXT- the pt does not some swelling in her lower extremities, she denies any pain for her wound on her right leg   Physical Exam: Blood pressure 99/62, pulse 109, temperature 98.2 F (36.8 C), temperature source Axillary, resp. rate 14, height 5\' 3"  (1.6 m), weight 62.2 kg (137 lb 2 oz), SpO2 94.00%.  Constitutional: WDWN Pt is resting comfortably in bed. She is awake and alert and does not appear  to be in any distress Head: Normocephalic and atraumatic.  Eyes: Conjunctivae, EOM and lids are normal. Pupils are equal, round, and reactive to light. No scleral icterus.  Neck: Trachea normal and normal range of  motion. Neck supple.  Cardiovascular: Normal heart sounds. Irregularly irregular rhythm present. Tachycardia present. I could not appreciate any murmurs.  Pulmonary/Chest: Effort normal and breath sounds normal. No respiratory distress. No wheezes, rales, rhonchi Abdominal: Soft. Normal appearance. There is no tenderness. There is no rebound, no guarding and no CVA tenderness.   Neurological: She is alert. She is oriented to person but not time or place. She does seem confused about the events leading up to today. She has normal strength.  Skin: Skin is warm, dry and intact. No rash noted.  EXT- 1+ pedal edema is noted bilaterally. The pt has a wound vac in place on  her right knee. There is no erythema or warmth around the wound site. The pt denies any pain around the wound site. No cyanosis or clubbing noted.  Access: LUA AVF  Dialysis- Done at [facility], MWF, Formula: NA-137, CA- 2.25, K- 4.0, DFR- 800, BFR- 350. She is on Epogen 10,000 units 3x week, Zemplar 3 mcg 3x week, Venofer 50 mg 1 x week-W.  Lab results: Basic Metabolic Panel:  Lab 04/24/11 8469 04/23/11 1605  NA 133* 133*  K 4.5 5.5*  CL 99 95*  CO2 27 26  GLUCOSE 119* 116*  BUN 20 15  CREATININE 3.55* 2.68*  CALCIUM 8.9 9.6  ALB -- --  PHOS 3.6 --   Liver Function Tests:  Lab 04/24/11 0530 04/23/11 1605  AST 30 62*  ALT 14 22  ALKPHOS 58 70  BILITOT 1.2 1.7*  PROT 6.6 8.9*  ALBUMIN 2.2* 2.9*   INR 4.90 CBC:  Lab 04/24/11 0530 04/23/11 1605  WBC 15.7* 11.2*  NEUTROABS -- 9.6*  HGB 9.9* 11.4*  HCT 29.9* 32.9*  MCV 76.9* 74.9*  PLT 142* 153   Blood Culture Pending  Urine Culture Pending  Cardiac Enzymes:  Lab 04/24/11 0530 04/23/11 1607  CKTOTAL 22 34  CKMB 1.0 1.0    CKMBINDEX -- --  TROPONINI <0.30 <0.30   CBG:  Lab 04/22/11 1054  GLUCAP 198*   Micro Results: Recent Results (from the past 240 hour(s))  MRSA PCR SCREENING     Status: Normal   Collection Time   04/24/11  4:24 AM      Component Value Range Status Comment   MRSA by PCR NEGATIVE  NEGATIVE  Final    Urinalysis    Component Value Date/Time   COLORURINE AMBER* 04/23/2011 2029   APPEARANCEUR CLEAR 04/23/2011 2029   LABSPEC 1.016 04/23/2011 2029   PHURINE 8.0 04/23/2011 2029   GLUCOSEU NEGATIVE 04/23/2011 2029   HGBUR NEGATIVE 04/23/2011 2029   BILIRUBINUR SMALL* 04/23/2011 2029   KETONESUR NEGATIVE 04/23/2011 2029   PROTEINUR >300* 04/23/2011 2029   UROBILINOGEN 2.0* 04/23/2011 2029   NITRITE NEGATIVE 04/23/2011 2029   LEUKOCYTESUR SMALL* 04/23/2011 2029     Studies/Results: Dg Chest 2 View  04/22/2011  *RADIOLOGY REPORT*  Clinical Data: History of recent pneumonia, cough, dialysis patient  CHEST - 2 VIEW  Comparison: None.  Findings: There is moderate cardiomegaly present.  Pericardial effusion cannot be excluded.  There do appear to be tiny pleural effusions present which may indicate a degree of failure. Mild volume loss is noted at the right lung base.  No focal infiltrate is seen. The bones are osteopenic and the descending thoracic aorta is ectatic.  IMPRESSION: Moderate cardiomegaly.  Cannot exclude pericardial effusion.  Also question mild CHF with probable small effusions.  Original Report Authenticated By: Juline Patch, M.D.  Ct Head Wo Contrast  04/23/2011  *RADIOLOGY REPORT*  Clinical Data: Altered mental status  CT HEAD WITHOUT CONTRAST  Technique:  Contiguous axial images were obtained from the base of the skull through the vertex without contrast.  Comparison: None.  Findings: Global atrophy.  Chronic ischemic changes. Encephalomalacia in the right occipital lobe has a chronic appearance.  No mass effect, midline shift, or acute intracranial hemorrhage. Mastoid air cells are  clear.  Cranium is intact.  IMPRESSION: Chronic ischemic changes and atrophy.  No acute intracranial pathology.  Original Report Authenticated By: Donavan Burnet, M.D.   Dg Chest Portable 1 View  04/23/2011  *RADIOLOGY REPORT*  Clinical Data: Back pain  PORTABLE CHEST - 1 VIEW  Comparison: Plain film 04/22/2011  Findings: Patient rotated leftward.  Cardiac silhouette is enlarged.  There is increased interstitial edema pattern compared to prior.  Lung bases are poorly evaluated.  IMPRESSION: Cardiomegaly and increased interstitial edema.  Low lung volumes.  Original Report Authenticated By: Genevive Bi, M.D.    Other results: EKG: AFIB  Assessment & Plan by Problem: 1. AMS: The pt is alert. She is oriented to person but not time or place, does have dementia at baseline but per reports family states she is normally functional and will occasionally see people who are no there and experience what is thought to be possible sundowning. No acute intracranial pathology on CT. 2.  Fever- There is concerns for possible sepsis. The pt was febrile on admission with temps as high as 103.  She has a noted elevation in her WBC count last report showing a WBC of 15.7.  She is currently on Vanc and Zosyn. A urinalysis was performed which was negative. Urine cultures are pending. Blood cultures were obtained and are pending. The right leg wound has an odor. Blood cultures are + for GPC in pairs and chains reportedly.  The AVF in the left arm shows no signs of infections.  Doing better clinically on IV abx. 3. ESRD- pt is on hemodialysis. She currently has dialysis done as an outpatient at [facility]. She is on a Mon, Wed, Fri schedule. She will need dialysis as an inpatient. She is on Epogen, Zemplar, and Venofer with her outpatient treatments.  4.Right Knee wound: has wound vac, more of the tibial region. Does not appear to be infected. 5.Afib RVR: On coumadin. INR: 4.90. Consider holding coumadin  for now. Pt currently on Cardizem and is rate controlled. An echo has been ordered and is pending.  6. Hypertension- takes cardizem, bystolic and hydralazine at home, currently on hold due to low BP.  7. DM : Continue glycemic management 8. Dementia: Mild, not current treatment. Pt functional before this event. 9. Hyponatremia- last NA was 133, monitor closely 10. Hypoalbuminemia- last albumin was 2.2, on a regular diet, consider switching to renal diet 11. Anemia- HGB 9.9, RDW 18.5, continue ESA at outpt.  Not on IV iron. 12. Thrombocytopenia- PLT 142 13. MBD- taking Sensipar tiw at home, Zemplar 3 ug, no binders.  This is a Psychologist, occupational Note.  The care of the patient was discussed with Dr. Arlean Hopping and the assessment and plan was formulated with their assistance.  Please see their note for official documentation of the patient encounter.   Signed: Morrell Riddle PA-S2 04/24/2011, 11:23 AM   Patient seen and examined and agree with assessment and plan as above with additions in bold. Vinson Moselle  MD [employer] [phone] pgr  [phone] cell 04/24/2011, 4:52 PM

## 2011-04-24 NOTE — Progress Notes (Signed)
I discussed the care plan with Dr. Gwenlyn Saran and the [facility] team and agree with assessment and plan as documented in the progress note for today.    Zamora Colton A. Sheffield Slider, MD Family Medicine Teaching Service Attending  04/24/2011 7:47 PM

## 2011-04-24 NOTE — ED Notes (Signed)
Pt placed on enteric contact to rule out c-diff. Family and pt made aware. Will continue to monitor.

## 2011-04-24 NOTE — ED Notes (Signed)
Called RN on 2600. Unavailable at this time. Will continue to monitor.

## 2011-04-24 NOTE — Progress Notes (Signed)
  Echocardiogram 2D Echocardiogram has been performed.  Cathie Beams Deneen 04/24/2011, 11:45 AM

## 2011-04-24 NOTE — H&P (Signed)
FMTS Attending Admission Note: Denny Levy MD [phone] pager office [phone] I  have seen and examined this patient, reviewed their chart. I have discussed this patient with the resident. I agree with the resident's findings, assessment and care plan. Her right lower leg wound (anterior tibia area--not really a knee wound but more a leg wound than described above) has no significant induration. Wound vac is still in place however and I did not remove. She is not oriented other than to person.

## 2011-04-24 NOTE — Consult Note (Signed)
WOC consult Note Reason for Consult: Pt has had vac to right leg wound at home since hematoma evacuated in January after a fall. Followed by outpatient wound care center. Wound type: Full thickness Measurement:7X2X.1cm Wound bed: 100% beefy red Drainage (amount, consistency, odor) Scant pink drainage, no odor Periwound: No erythema or edema Dressing procedure/placement/frequency: Applied one piece black sponge to 125cm cont suction.  Tolerated without pain.  Home vac machine left at bedside and daughter at bedside.  Placed on hospital vac.  Plan for bedside nurse to hange Q Sat/Tues/Thus   Cammie Mcgee, RN, MSN, [employer]  [phone]

## 2011-04-25 ENCOUNTER — Inpatient Hospital Stay (HOSPITAL_COMMUNITY): Payer: Medicare Other

## 2011-04-25 DIAGNOSIS — M79609 Pain in unspecified limb: Secondary | ICD-10-CM

## 2011-04-25 DIAGNOSIS — M7989 Other specified soft tissue disorders: Secondary | ICD-10-CM

## 2011-04-25 LAB — BASIC METABOLIC PANEL
CO2: 25 mEq/L (ref 19–32)
Calcium: 9.4 mg/dL (ref 8.4–10.5)
Chloride: 97 mEq/L (ref 96–112)
Glucose, Bld: 108 mg/dL — ABNORMAL HIGH (ref 70–99)
Sodium: 133 mEq/L — ABNORMAL LOW (ref 135–145)

## 2011-04-25 LAB — CBC
HCT: 31.3 % — ABNORMAL LOW (ref 36.0–46.0)
Hemoglobin: 10.5 g/dL — ABNORMAL LOW (ref 12.0–15.0)
MCH: 25.7 pg — ABNORMAL LOW (ref 26.0–34.0)
MCHC: 33.5 g/dL (ref 30.0–36.0)
RBC: 4.09 MIL/uL (ref 3.87–5.11)

## 2011-04-25 LAB — DIFFERENTIAL
Basophils Relative: 0 % (ref 0–1)
Lymphs Abs: 1.8 10*3/uL (ref 0.7–4.0)
Monocytes Absolute: 1.5 10*3/uL — ABNORMAL HIGH (ref 0.1–1.0)
Monocytes Relative: 12 % (ref 3–12)
Neutro Abs: 9.4 10*3/uL — ABNORMAL HIGH (ref 1.7–7.7)
Neutrophils Relative %: 74 % (ref 43–77)

## 2011-04-25 LAB — GLUCOSE, CAPILLARY
Glucose-Capillary: 118 mg/dL — ABNORMAL HIGH (ref 70–99)
Glucose-Capillary: 162 mg/dL — ABNORMAL HIGH (ref 70–99)
Glucose-Capillary: 100 mg/dL — ABNORMAL HIGH (ref 70–99)

## 2011-04-25 LAB — PROTIME-INR: INR: 5.78 (ref 0.00–1.49)

## 2011-04-25 MED ORDER — DARBEPOETIN ALFA-POLYSORBATE 60 MCG/0.3ML IJ SOLN
INTRAMUSCULAR | Status: AC
  Filled 2011-04-25: qty 0.3

## 2011-04-25 MED ORDER — SODIUM CHLORIDE 0.9 % IV SOLN
62.5000 mg | INTRAVENOUS | Status: DC
  Administered 2011-04-25: 62.5 mg via INTRAVENOUS
  Filled 2011-04-25 (×2): qty 5

## 2011-04-25 MED ORDER — PARICALCITOL 5 MCG/ML IV SOLN
INTRAVENOUS | Status: AC
  Filled 2011-04-25: qty 1

## 2011-04-25 MED ORDER — ACETAMINOPHEN 325 MG PO TABS
ORAL_TABLET | ORAL | Status: AC
  Filled 2011-04-25: qty 2

## 2011-04-25 MED ORDER — PHYTONADIONE 5 MG PO TABS
2.5000 mg | ORAL_TABLET | Freq: Once | ORAL | Status: AC
  Administered 2011-04-25: 2.5 mg via ORAL
  Filled 2011-04-25: qty 1

## 2011-04-25 MED ORDER — CEFAZOLIN SODIUM 1-5 GM-% IV SOLN
1.0000 g | Freq: Every day | INTRAVENOUS | Status: DC
  Administered 2011-04-25 – 2011-04-26 (×2): 1 g via INTRAVENOUS
  Filled 2011-04-25 (×3): qty 50

## 2011-04-25 MED ORDER — SODIUM CHLORIDE 0.9 % IV SOLN: 62.5000 mg | INTRAVENOUS | Status: DC

## 2011-04-25 NOTE — Clinical Documentation Improvement (Signed)
CHANGE MENTAL STATUS DOCUMENTATION CLARIFICATION   THIS DOCUMENT IS NOT A PERMANENT PART OF THE MEDICAL RECORD  TO RESPOND TO THE THIS QUERY, FOLLOW THE INSTRUCTIONS BELOW:  1. If needed, update documentation for the patient's encounter via the notes activity.  2. Access this query again and click edit on the In [employer].  3. After updating, or not, click F2 to complete all highlighted (required) fields concerning your review. Select "additional documentation in the medical record" OR "no additional documentation provided".  4. Click Sign note button.  5. The deficiency will fall out of your In Basket *Please let us know if you are not able to complete this workflow by phone or e-mail (listed below).         04/25/11  Dear Dr.  Marcelle Overlie [facility]  In an effort to better capture your patient's severity of illness, reflect appropriate length of stay and utilization of resources, a review of the patient medical record has revealed the following indicators.    Based on your clinical judgment, please clarify and document in a progress note and/or discharge summary the clinical condition associated with the following supporting information:  In responding to this query please exercise your independent judgment.  The fact that a query is asked, does not imply that any particular answer is desired or expected.  May also state that the condition is resolving, resolved,etc    Possible Clinical Conditions ____Septic ___Encephalopathy (describe type if known)                       Septic                       Metabolic                       Toxic  ____x___Acute delirium  _______Other Condition  _______Cannot Clinically Determine   Supporting Information:  Risk Factors: ESRD on HD "Possible Sepsis" "Fever" Having episodes of watery diarrhea at home Rt leg wound vac since January Hx DM 2, HTN  Signs &  Symptoms: "AMS" 'Lethargic" "Delirium"  Diagnostics:  Procal-22.92 on 04/23/11  Bld Cx on 04/23/11 STREPTOCOCCUS GROUP G Note: Gram Stain Report Called   Treatment: Restraints Antibiotic therapy-Vancomycin, Zosyn, Cefazolin    Continuous Monitoring  You may use possible, probable, or suspect with inpatient documentation. possible, probable, suspected diagnoses MUST be documented at the time of discharge     Reviewed: additional documentation in the medical record  Thank You,    Rossie Muskrat RN, BSN  Clinical Documentation Specialist Pager:  [phone] [email or web address]  [facility] [place]

## 2011-04-25 NOTE — Progress Notes (Deleted)
PA Student Daily Progress Note [facility] Subjective: Pt states she is feeling well. She denies any CP or SOB. She denies any abd pain. She denies any recent N/VD. Denies any chills.  Objective:  Filed Vitals:   04/25/11 0033 04/25/11 0500 04/25/11 0645 04/25/11 0828  BP: 116/70 110/69 123/73 107/59  Pulse: 88 104  90  Temp: 97.9 F (36.6 C) 98 F (36.7 C)  98.1 F (36.7 C)  TempSrc: Oral Oral  Oral  Resp: 20 20  18   Height:      Weight:      SpO2: 98% 98%  99%   Physical Exam: Constitutional: WDWN Pt is sitting up in bed eating breakfast. She is awake and alert and does not appear to be in any distress  Head: Normocephalic and atraumatic.  Eyes: Conjunctivae, EOM and lids are normal. Pupils are equal, round, and reactive to light. No scleral icterus.  Neck: Trachea normal and normal range of motion. Neck supple.  Cardiovascular: Normal heart sounds. Irregularly irregular rhythm present. Tachycardia present. I could not appreciate any murmurs.  Pulmonary/Chest: Effort normal and breath sounds normal. No respiratory distress. No wheezes, rales, rhonchi  Abdominal: Soft. Normal appearance. There is no tenderness. There is no rebound, no guarding and no CVA tenderness.  Neurological: She is alert. She is oriented to person but not time or place. She is able to respond to questions but will have some occasional confusion. She has normal strength.  Skin: Skin is warm, dry and intact. No rash noted.  EXT- 1+ pedal edema is noted bilaterally. The pt has a wound vac in place on her right knee. There is no erythema or warmth around the wound site. The pt denies any pain around the wound site. No cyanosis or clubbing noted.  Access: LUA AVF  Dialysis- Done at [facility], MWF, Formula: NA-137, CA- 2.25, K- 4.0, DFR- 800, BFR- 350. She is on Epogen 10,000 units 3x week, Zemplar 3 mcg 3x week, Venofer 50 mg 1 x week-W.  Assessment/Plan: 1. AMS: Seems improved.  The pt is alert. She is oriented to person but not time or place, does have dementia at baseline and per family yesterday the pt seems to be back to her baseline mental status 2. Sepsis- Blood cultures are growing gram + cocci in pairs and chains. It is still unclear where the infection is coming from. Urine culture shows no growth. Wound care nurse saw pt yesterday for wound on rt leg. Pt was placed on a hospital wound vac. Wound shows no signs of infection. The AVF in left arm shows no signs of infection. Pt remains on Vanc and Zosyn. She appears to be doing well clinically.  3. ESRD- pt is on hemodialysis. She currently has dialysis done as an outpatient at [facility]. She is on a Mon, Wed, Fri schedule. I do not see that she has any dialysis orders as an inpatient. Advised Dr. Arlean Hopping about this.  4.Right Knee wound: has wound vac, more of the tibial region. Does not appear to be infected.  5.Afib RVR: On coumadin. INR: 5.78. Coumadin on hold. Pt given Vit K 2.5 mg X 1 this morning. Pt currently on Cardizem and is rate controlled.  6. Hypertension- on cardizem. bystolic and hydralazine at home, currently on hold due to low BP.  7. DM : Continue glycemic management  8. Dementia: Mild, not current treatment. Pt functional before this event.  9. Hyponatremia- stable, last NA  was 133, monitor closely  10. Hypoalbuminemia- last albumin was 2.2, on a regular diet, consider switching to renal diet  11. Anemia- HGB 10.5, Pt to receive Aranesp with dialysis tx. Not on IV iron.  12. Thrombocytopenia- PLT 136 Decreased form 142 yesterday 13. MBD- taking Sensipar tiw at home, Zemplar 3 ug, no binders.  Labs: Basic Metabolic Panel:  Lab 04/25/11 4098 04/24/11 0530 04/23/11 1605  NA 133* 133* 133*  K 4.4 4.5 5.5*  CL 97 99 95*  CO2 25 27 26   GLUCOSE 108* 119* 116*  BUN 31* 20 15  CREATININE 4.08* 3.55* 2.68*  CALCIUM 9.4 8.9 9.6  ALB -- -- --  PHOS -- 3.6 --   Liver Function  Tests:  Lab 04/24/11 0530 04/23/11 1605  AST 30 62*  ALT 14 22  ALKPHOS 58 70  BILITOT 1.2 1.7*  PROT 6.6 8.9*  ALBUMIN 2.2* 2.9*   INR: @resultsinr3 @ CBC:  Lab 04/25/11 0430 04/24/11 0530 04/23/11 1605  WBC 12.8* 15.7* 11.2*  NEUTROABS 9.4* -- 9.6*  HGB 10.5* 9.9* 11.4*  HCT 31.3* 29.9* 32.9*  MCV 76.5* 76.9* 74.9*  PLT 136* 142* 153   Blood Culture    Component Value Date/Time   SDES URINE, CATHETERIZED 04/23/2011 2029   SPECREQUEST NONE 04/23/2011 2029   CULT NO GROWTH 04/23/2011 2029   REPTSTATUS 04/24/2011 FINAL 04/23/2011 2029    Cardiac Enzymes:  Lab 04/24/11 2147 04/24/11 1343 04/24/11 0530 04/23/11 1607  CKTOTAL 50 20 22 34  CKMB 1.0 1.0 1.0 1.0  CKMBINDEX -- -- -- --  TROPONINI <0.30 <0.30 <0.30 <0.30   CBG:  Lab 04/25/11 0830 04/25/11 0445 04/24/11 2351 04/24/11 1950 04/24/11 1845  GLUCAP 100* 118* 162* 202* 166*   Iron Studies: No results found for this basename: IRON,TIBC,TRANSFERRIN,FERRITIN in the last 72 hours  Micro Results: Recent Results (from the past 240 hour(s))  CULTURE, BLOOD (ROUTINE X 2)     Status: Normal (Preliminary result)   Collection Time   04/23/11  4:05 PM      Component Value Range Status Comment   Specimen Description BLOOD NECK RIGHT   Final    Special Requests BOTTLES DRAWN AEROBIC AND ANAEROBIC 5CC RIGHT IJ   Final    Culture  Setup Time [id number]   Final    Culture     Final    Value: GRAM POSITIVE COCCI IN PAIRS AND CHAINS     Note: Gram Stain Report Called to,Read Back By and Verified With: ELLA BETHEL @1152  04/24/11 BY KRAWS   Report Status PENDING   Incomplete   CULTURE, BLOOD (ROUTINE X 2)     Status: Normal (Preliminary result)   Collection Time   04/23/11  4:35 PM      Component Value Range Status Comment   Specimen Description BLOOD HAND RIGHT   Final    Special Requests BOTTLES DRAWN AEROBIC ONLY 4CC   Final    Culture  Setup Time [id number]   Final    Culture     Final    Value: GRAM POSITIVE COCCI IN  PAIRS AND CHAINS     Note: Gram Stain Report Called to,Read Back By and Verified With: ELLA BETHEL @1152  04/24/11 BY KRAWS   Report Status PENDING   Incomplete   URINE CULTURE     Status: Normal   Collection Time   04/23/11  8:29 PM      Component Value Range Status Comment   Specimen Description URINE, CATHETERIZED  Final    Special Requests NONE   Final    Culture  Setup Time [id number]   Final    Colony Count NO GROWTH   Final    Culture NO GROWTH   Final    Report Status 04/24/2011 FINAL   Final   MRSA PCR SCREENING     Status: Normal   Collection Time   04/24/11  4:24 AM      Component Value Range Status Comment   MRSA by PCR NEGATIVE  NEGATIVE  Final    Studies/Results: Dg Chest 2 View  04/24/2011  *RADIOLOGY REPORT*  Clinical Data: Sepsis.  Positive blood culture.  CHEST - 2 VIEW  Comparison: 03/13 and 04/22/2011  Findings: The patient has new small bilateral pleural effusions. Marked cardiomegaly.  Pulmonary vascularity is normal.  No consolidative infiltrates or effusions.  No acute osseous abnormality.  IMPRESSION: New small bilateral effusions.  No pulmonary infiltrates.  Original Report Authenticated By: Gwynn Burly, M.D.   Ct Head Wo Contrast  04/23/2011  *RADIOLOGY REPORT*  Clinical Data: Altered mental status  CT HEAD WITHOUT CONTRAST  Technique:  Contiguous axial images were obtained from the base of the skull through the vertex without contrast.  Comparison: None.  Findings: Global atrophy.  Chronic ischemic changes. Encephalomalacia in the right occipital lobe has a chronic appearance.  No mass effect, midline shift, or acute intracranial hemorrhage. Mastoid air cells are clear.  Cranium is intact.  IMPRESSION: Chronic ischemic changes and atrophy.  No acute intracranial pathology.  Original Report Authenticated By: Donavan Burnet, M.D.   Dg Chest Portable 1 View  04/23/2011  *RADIOLOGY REPORT*  Clinical Data: Back pain  PORTABLE CHEST - 1 VIEW  Comparison: Plain  film 04/22/2011  Findings: Patient rotated leftward.  Cardiac silhouette is enlarged.  There is increased interstitial edema pattern compared to prior.  Lung bases are poorly evaluated.  IMPRESSION: Cardiomegaly and increased interstitial edema.  Low lung volumes.  Original Report Authenticated By: Genevive Bi, M.D.   Medications: Scheduled Meds:   . cinacalcet  30 mg Oral 3 times weekly  . darbepoetin  60 mcg Intravenous Q Fri-HD  . diltiazem  30 mg Oral Q6H  . mulitivitamin with minerals  1 tablet Oral Daily  . pantoprazole (PROTONIX) IV  40 mg Intravenous QHS  . paricalcitol  3 mcg Intravenous 3 times weekly  . phytonadione  2.5 mg Oral Once  . piperacillin-tazobactam (ZOSYN)  IV  2.25 g Intravenous Q8H  . vancomycin  750 mg Intravenous Q M,W,F-HD  . Warfarin - Pharmacist Dosing Inpatient   Does not apply q1800  . DISCONTD: diltiazem  120 mg Oral QHS  . DISCONTD: diltiazem  30 mg Oral Q6H   Continuous Infusions:   . sodium chloride 50 mL/hr at 04/24/11 1700   PRN Meds:.acetaminophen, acetaminophen, ondansetron (ZOFRAN) IV, ondansetron   This is a Psychologist, occupational Note.  The care of the patient was discussed with Dr. Arlean Hopping and the assessment and plan formulated with their assistance.  Please see their attached note for official documentation of the daily encounter.  Morrell Riddle PA-S2 04/25/2011, 10:42 AM

## 2011-04-25 NOTE — Progress Notes (Signed)
I interviewed and examined this patient and discussed the care plan with Dr. Aviva Signs and the [facility] team and agree with assessment and plan as documented in the progress note for today.    Mackinley Cassaday A. Sheffield Slider, MD Family Medicine Teaching Service Attending  04/25/2011 9:47 PM

## 2011-04-25 NOTE — Progress Notes (Signed)
CRITICAL VALUE ALERT  Critical value received:  INR= 5.78  Date of notification:  04/25/2011  Time of notification:  0530  Critical value read back:yes  Nurse who received alert:  Timmie Foerster, RN  MD notified (1st page):  Berline Chough  Time of first page:  0533  MD notified (2nd page):  Time of second page:  Responding MD:  Berline Chough  Time MD responded:  [phone]

## 2011-04-25 NOTE — Progress Notes (Signed)
Subjective:  Pt alert, upset about being "tied up" and brought to hospital  Objective:    Vital signs in last 24 hours: Filed Vitals:   04/25/11 0500 04/25/11 0645 04/25/11 0828 04/25/11 1136  BP: 110/69 123/73 107/59 129/79  Pulse: 104  90 91  Temp: 98 F (36.7 C)  98.1 F (36.7 C) 97.3 F (36.3 C)  TempSrc: Oral  Oral Oral  Resp: 20  18 15   Height:      Weight:      SpO2: 98%  99% 98%   Weight change:   Intake/Output Summary (Last 24 hours) at 04/25/11 1300 Last data filed at 04/24/11 1330  Gross per 24 hour  Intake    120 ml  Output      0 ml  Net    120 ml   Labs: Basic Metabolic Panel:  Lab 04/25/11 1478 04/24/11 0530 04/23/11 1605  NA 133* 133* 133*  K 4.4 4.5 5.5*  CL 97 99 95*  CO2 25 27 26   GLUCOSE 108* 119* 116*  BUN 31* 20 15  CREATININE 4.08* 3.55* 2.68*  ALB -- -- --  CALCIUM 9.4 8.9 9.6  PHOS -- 3.6 --   Liver Function Tests:  Lab 04/24/11 0530 04/23/11 1605  AST 30 62*  ALT 14 22  ALKPHOS 58 70  BILITOT 1.2 1.7*  PROT 6.6 8.9*  ALBUMIN 2.2* 2.9*   No results found for this basename: LIPASE:3,AMYLASE:3 in the last 168 hours No results found for this basename: AMMONIA:3 in the last 168 hours CBC:  Lab 04/25/11 0430 04/24/11 0530 04/23/11 1605  WBC 12.8* 15.7* 11.2*  NEUTROABS 9.4* -- 9.6*  HGB 10.5* 9.9* 11.4*  HCT 31.3* 29.9* 32.9*  MCV 76.5* 76.9* 74.9*  PLT 136* 142* 153   Cardiac Enzymes:  Lab 04/24/11 2147 04/24/11 1343 04/24/11 0530 04/23/11 1607  CKTOTAL 50 20 22 34  CKMB 1.0 1.0 1.0 1.0  CKMBINDEX -- -- -- --  TROPONINI <0.30 <0.30 <0.30 <0.30   CBG:  Lab 04/25/11 0830 04/25/11 0445 04/24/11 2351 04/24/11 1950 04/24/11 1845  GLUCAP 100* 118* 162* 202* 166*    Iron Studies: No results found for this basename: IRON:30,TIBC:30,SATURATION RATIOS:30,TRANSFERRIN:30,FERRITIN:30 in the last 168 hours  Medications:    . DISCONTD: sodium chloride 50 mL/hr at 04/24/11 1700      . cinacalcet  30 mg Oral 3 times weekly  .  darbepoetin  60 mcg Intravenous Q Fri-HD  . diltiazem  30 mg Oral Q6H  . ferric gluconate (FERRLECIT/NULECIT) IV  62.5 mg Intravenous Weekly  . mulitivitamin with minerals  1 tablet Oral Daily  . pantoprazole (PROTONIX) IV  40 mg Intravenous QHS  . paricalcitol  3 mcg Intravenous 3 times weekly  . phytonadione  2.5 mg Oral Once  . piperacillin-tazobactam (ZOSYN)  IV  2.25 g Intravenous Q8H  . vancomycin  750 mg Intravenous Q M,W,F-HD  . Warfarin - Pharmacist Dosing Inpatient   Does not apply q1800  . DISCONTD: ferric gluconate (FERRLECIT/NULECIT) IV  62.5 mg Intravenous Weekly    I  have reviewed scheduled and prn medications.  Physical Exam:  Blood pressure 129/79, pulse 91, temperature 97.3 F (36.3 C), temperature source Oral, resp. rate 15, height 5\' 3"  (1.6 m), weight 62.2 kg (137 lb 2 oz), SpO2 98.00%.  Gen:  Alert, no distress Neck: Trachea normal and normal range of motion. Neck supple.  Cardiovascular: Normal heart sounds. Irregularly irregular rhythm present. Tachycardia present. I could not appreciate any  murmurs.  Pulmonary/Chest: Effort normal and breath sounds normal. No respiratory distress. No wheezes, rales, rhonchi  Abdominal: Soft. Normal appearance. There is no tenderness. There is no rebound, no guarding and no CVA tenderness.  Neurological: She is alert. She is oriented to person but not time or place. She does seem confused about the events leading up to today. She has normal strength.  Skin: Skin is warm, dry and intact. No rash noted.  EXT- 1+ pedal edema is noted bilaterally. The pt has a wound vac in place on her right lower leg. + odor. Access: LUA AVF  Dialysis- Done at [facility], MWF, Formula: NA-137, CA- 2.25, K- 4.0, DFR- 800, BFR- 350. She is on Epogen 10,000 units 3x week, Zemplar 3 mcg 3x week, Venofer 50 mg 1 x week-W.  Problem/Plan: 1. AMS: resolved, pt at baseline with mild dementia per family.  Head CT was neg. 2. Fever- The AVF  in the left arm shows no signs of infections.  +BCx's with group G strep x 2, UA neg. Per primary. Fevers better on abx. 3. ESRD / MWF  - dialysis today, see orders. 4. Right leg wound: s/p fall with hematoma that required I&D, now with large wound and wound vac, ? Source of infection.  5. Afib RVR: off IV dilt, on coumadin, po dilt 6. Hypertension- takes cardizem, bystolic and hydralazine at home, on po diltiazem here only for now, BP 7. Dementia: Mild, not current treatment. Pt functional before this event.  8. Hypoalbuminemia- last albumin was 2.2, on a regular diet, consider switching to renal diet  9. Anemia- HGB 9.9, RDW 18.5, continue ESA at outpt. Not on IV iron.  10. Thrombocytopenia- PLT 142  11. MBD- taking Sensipar tiw at home, Zemplar 3 ug, no binders.  Vinson Moselle  MD [facility] [phone] pgr    [phone] cell 04/25/2011, 1:00 PM

## 2011-04-25 NOTE — Progress Notes (Signed)
ANTIBIOTIC CONSULT NOTE - FOLLOW UP  Pharmacy Consult for Vancomycin, Zosyn, and Coumadin Indication: cellulitis-right leg and afib  Allergies  Allergen Reactions  . Avandia (Rosiglitazone Maleate)   . Clonidine Derivatives Other (See Comments)    unknown  . Hctz (Hydrochlorothiazide) Other (See Comments)    unknown  . Lactose Intolerance (Gi)   . Lisinopril Other (See Comments)    unknown  . Tekturna (Aliskiren Fumarate) Other (See Comments)    unknown    Patient Measurements: Height: 5\' 3"  (160 cm) Weight: 137 lb 2 oz (62.2 kg) IBW/kg (Calculated) : 52.4    Vital Signs: Temp: 98.1 F (36.7 C) (03/15 0828) Temp src: Oral (03/15 0828) BP: 107/59 mmHg (03/15 0828) Pulse Rate: 90  (03/15 0828) Intake/Output from previous day: 03/14 0701 - 03/15 0700 In: 360 [P.O.:360] Out: -  Intake/Output from this shift:    Labs:  Basename 04/25/11 0430 04/24/11 0530 04/23/11 1605  WBC 12.8* 15.7* 11.2*  HGB 10.5* 9.9* 11.4*  PLT 136* 142* 153  LABCREA -- -- --  CREATININE 4.08* 3.55* 2.68*     Microbiology: Recent Results (from the past 720 hour(s))  CULTURE, BLOOD (ROUTINE X 2)     Status: Normal (Preliminary result)   Collection Time   04/23/11  4:05 PM      Component Value Range Status Comment   Specimen Description BLOOD NECK RIGHT   Final    Special Requests BOTTLES DRAWN AEROBIC AND ANAEROBIC 5CC RIGHT IJ   Final    Culture  Setup Time [id number]   Final    Culture     Final    Value: GRAM POSITIVE COCCI IN PAIRS AND CHAINS     Note: Gram Stain Report Called to,Read Back By and Verified With: ELLA BETHEL @1152  04/24/11 BY KRAWS   Report Status PENDING   Incomplete   CULTURE, BLOOD (ROUTINE X 2)     Status: Normal (Preliminary result)   Collection Time   04/23/11  4:35 PM      Component Value Range Status Comment   Specimen Description BLOOD HAND RIGHT   Final    Special Requests BOTTLES DRAWN AEROBIC ONLY 4CC   Final    Culture  Setup Time [id number]   Final     Culture     Final    Value: GRAM POSITIVE COCCI IN PAIRS AND CHAINS     Note: Gram Stain Report Called to,Read Back By and Verified With: ELLA BETHEL @1152  04/24/11 BY KRAWS   Report Status PENDING   Incomplete   URINE CULTURE     Status: Normal   Collection Time   04/23/11  8:29 PM      Component Value Range Status Comment   Specimen Description URINE, CATHETERIZED   Final    Special Requests NONE   Final    Culture  Setup Time [id number]   Final    Colony Count NO GROWTH   Final    Culture NO GROWTH   Final    Report Status 04/24/2011 FINAL   Final   MRSA PCR SCREENING     Status: Normal   Collection Time   04/24/11  4:24 AM      Component Value Range Status Comment   MRSA by PCR NEGATIVE  NEGATIVE  Final     Anti-infectives     Start     Dose/Rate Route Frequency Ordered Stop   04/25/11 1800   vancomycin (VANCOCIN) 500 mg in sodium chloride 0.9 % 100  mL IVPB  Status:  Discontinued        500 mg 100 mL/hr over 60 Minutes Intravenous Every 48 hours 04/24/11 0418 04/24/11 0917   04/25/11 1200   vancomycin (VANCOCIN) 750 mg in sodium chloride 0.9 % 150 mL IVPB        750 mg 150 mL/hr over 60 Minutes Intravenous Every M-W-F (Hemodialysis) 04/24/11 0917     04/24/11 1400   piperacillin-tazobactam (ZOSYN) IVPB 2.25 g        2.25 g 100 mL/hr over 30 Minutes Intravenous 3 times per day 04/24/11 0917     04/24/11 0600   piperacillin-tazobactam (ZOSYN) IVPB 2.25 g  Status:  Discontinued        2.25 g 100 mL/hr over 30 Minutes Intravenous Every 6 hours 04/24/11 0418 04/24/11 0917   04/23/11 1730   vancomycin (VANCOCIN) injection 1 g  Status:  Discontinued        1 g Intravenous To [facility] 04/23/11 1702 04/23/11 1720   04/23/11 1730   piperacillin-tazobactam (ZOSYN) IVPB 2.25 g        2.25 g 100 mL/hr over 30 Minutes Intravenous To [facility] 04/23/11 1702 04/23/11 1907   04/23/11 1730   vancomycin (VANCOCIN) IVPB 1000 mg/200 mL premix        1,000  mg 200 mL/hr over 60 Minutes Intravenous To [facility] 04/23/11 1720 04/23/11 1827          Assessment: RLE cellulitis: On Day #3 of Vancomycin and Zosyn.  She received an adequate Vancomycin loading dose.  Will adjust regimen for HD-dependent ESRD.  May be able to narrow antibiotics soon when identification and sensitivity of organism on blood culture becomes available.  History of Afib:  Her INR remains supratherapeutic, likely influenced by acute illness.    Goal of Therapy:  Pre-dialysis Vancomycin level 15-25 mcg/mL INR=2-3  Plan:  Continue Vancomycin to 750mg  IV with each HD session. Continue Zosyn to 2.25gm IV q8h for HD-dependent ESRD. Follow for HD plans and HD tolerance. No Coumadin today. Daily PT/INR monitoring.  Celedonio Miyamoto, PharmD, BCPS Clinical Pharmacist Pager [phone]  04/25/2011, 8:39 AM

## 2011-04-25 NOTE — Progress Notes (Signed)
Daily Progress Note Family Medicine Teaching Service PGY-1   Patient name: Miranda Cook  Medical record [id number] Date of birth:1922-06-26 Age: 78 y.o. Gender: female  LOS: 2 days   Subjective: No events overnight.  Objective: Vitals: Patient Vitals for the past 24 hrs:  BP Temp Temp src Pulse Resp SpO2  04/25/11 0828 107/59 mmHg 98.1 F (36.7 C) Oral 90  18  99 %    Physical Exam: General appearance: alert and oriented to person, knows she is in the hospital but doesn't where. No oriented to time.  Resp: diminished breath sounds, normal work of breathing.  Cardio: s1s2, regular rate and rhythm, no murmur appreciated.  GI: present bowel sounds, soft, non tender, non distended  Extremities: wound vac in place on right leg, minimal erythema around wound, no warmth, no tenderness  Skin: minimal erythema on right leg.  Labs and imaging:  CBC  Lab 04/25/11 0430 04/24/11 0530 04/23/11 1605  WBC 12.8* 15.7* 11.2*  HGB 10.5* 9.9* 11.4*  HCT 31.3* 29.9* 32.9*  PLT 136* 142* 153   BMET  Lab 04/25/11 0430 04/24/11 0530 04/23/11 1605  NA 133* 133* 133*  K 4.4 4.5 5.5*  CL 97 99 95*  CO2 25 27 26   BUN 31* 20 15  CREATININE 4.08* 3.55* 2.68*  LABGLOM -- -- --  GLUCOSE 108* -- --  CALCIUM 9.4 8.9 9.6    Medications: Scheduled Meds:   . cinacalcet  30 mg Oral 3 times weekly  . darbepoetin  60 mcg Intravenous Q Fri-HD  . diltiazem  30 mg Oral Q6H  . mulitivitamin with minerals  1 tablet Oral Daily  . pantoprazole (PROTONIX) IV  40 mg Intravenous QHS  . paricalcitol  3 mcg Intravenous 3 times weekly  . phytonadione  2.5 mg Oral Once  . piperacillin-tazobactam (ZOSYN)  IV  2.25 g Intravenous Q8H  . vancomycin  750 mg Intravenous Q M,W,F-HD  . Warfarin - Pharmacist Dosing Inpatient   Does not apply q1800  . DISCONTD: diltiazem  120 mg Oral QHS  . DISCONTD: diltiazem  30 mg Oral Q6H   Continuous Infusions:   . sodium chloride 50 mL/hr at 04/24/11 1700   PRN  Meds:.acetaminophen, acetaminophen, ondansetron (ZOFRAN) IV, ondansetron Assessment and Plan: Miranda Cook is a 78 y.o. female with Hx of Afib on coumadin, DM, HTN and ESRD on dialysis presenting with AMS since last Friday.  1. AMS:  Back to her baseline, does have dementia at baseline. Positive Blood Cx for gram positive cocci. Ucx negative. CXR with no signs of PNA and no respiratory symptoms. Not clear what source of infection but only place to point will be her right leg wound.  - Vanc   2. Right LE wound: has wound vac, more of the tibial region.  - Wound consult    3. CKD: creatinine 4.08.   -Scheduled Dialysis.   3.Afib: On coumadin. INR: 5.78  Will hold coumadin for now. No signs of bleeding. Hb stable. -continue Cardizem to a lower dose since at this point BP is important to monitor.   3. HTN: hold bistolic and hydralazine for now and monitor.   4. DM : A1C 5.5   -SSI   5. Dementia: Mild, not current treatment. Pt functional before this event.   FEN/GI: NS 50 ml/h per 10 hours then will see if pt able to tolerate PO.  Prophylaxis: INR elevated.  Disposition: pending improvement.  FEN/GI:  Prophylaxis:   Disposition:   D. [place]  Rolene Arbour, MD PGY1, Family Medicine Teaching Service Pager [phone] 04/25/2011    no

## 2011-04-25 NOTE — Progress Notes (Signed)
Bilateral lower extremity venous duplex completed.  Preliminary report is negative for DVT, SVT, or a Baker's cyst. 

## 2011-04-26 LAB — CULTURE, BLOOD (ROUTINE X 2): Culture  Setup Time: 201303140211

## 2011-04-26 LAB — BASIC METABOLIC PANEL
BUN: 15 mg/dL (ref 6–23)
Chloride: 101 mEq/L (ref 96–112)
GFR calc Af Amer: 17 mL/min — ABNORMAL LOW (ref >90)
GFR calc non Af Amer: 14 mL/min — ABNORMAL LOW (ref >90)
Potassium: 3.5 mEq/L (ref 3.5–5.1)
Sodium: 139 mEq/L (ref 135–145)

## 2011-04-26 LAB — CBC
MCHC: 34.1 g/dL (ref 30.0–36.0)
Platelets: 174 10*3/uL (ref 150–400)
RDW: 18 % — ABNORMAL HIGH (ref 11.5–15.5)
WBC: 7.7 10*3/uL (ref 4.0–10.5)

## 2011-04-26 LAB — GLUCOSE, CAPILLARY
Glucose-Capillary: 101 mg/dL — ABNORMAL HIGH (ref 70–99)
Glucose-Capillary: 113 mg/dL — ABNORMAL HIGH (ref 70–99)
Glucose-Capillary: 130 mg/dL — ABNORMAL HIGH (ref 70–99)
Glucose-Capillary: 139 mg/dL — ABNORMAL HIGH (ref 70–99)
Glucose-Capillary: 155 mg/dL — ABNORMAL HIGH (ref 70–99)

## 2011-04-26 MED ORDER — PANTOPRAZOLE SODIUM 40 MG PO TBEC
40.0000 mg | DELAYED_RELEASE_TABLET | Freq: Every day | ORAL | Status: DC
  Administered 2011-04-26: 40 mg via ORAL
  Filled 2011-04-26: qty 1

## 2011-04-26 NOTE — Progress Notes (Signed)
Subjective: No complaints.  Objective: Vital signs in last 24 hours: Temp:  [97.1 F (36.2 C)-98.1 F (36.7 C)] 97.8 F (36.6 C) (03/16 0827) Pulse Rate:  [85-110] 94  (03/16 0827) Resp:  [15-18] 18  (03/16 0827) BP: (92-152)/(46-99) 121/73 mmHg (03/16 0827) SpO2:  [94 %-99 %] 94 % (03/16 0827) Weight:  [135 lb 5.8 oz (61.4 kg)-142 lb 3.2 oz (64.5 kg)] 139 lb 8.8 oz (63.3 kg) (03/16 0100)  Intake/Output from previous day: 03/15 0701 - 03/16 0700 In: 400 [I.V.:400] Out: 3000  Intake/Output this shift: Total I/O In: 400 [P.O.:400] Out: -   Physical exam: Gen: NAD, sitting up in bed eating breakfast Psych: alert but only oriented to name (date: "1910's", place: "hospital" but "not sure where" "just somewhere I passing through") CV: irregularly irregular Pulm: CTAB without w/r/r; decreased BS bilateral bases Abd: soft, NT Ext: wound vac over right shin, tender, with ?odor, and with erythema from knee to foot; no calf tenderness; 2+ pedal pulses bilaterally  Results for orders placed during the hospital encounter of 04/23/11 (from the past 24 hour(s))  CLOSTRIDIUM DIFFICILE BY PCR     Status: Normal   Collection Time   04/25/11 10:50 AM      Component Value Range   C difficile by pcr NEGATIVE  NEGATIVE   GLUCOSE, CAPILLARY     Status: Abnormal   Collection Time   04/25/11 11:39 AM      Component Value Range   Glucose-Capillary 117 (*) 70 - 99 (mg/dL)  GLUCOSE, CAPILLARY     Status: Abnormal   Collection Time   04/25/11  3:22 PM      Component Value Range   Glucose-Capillary 155 (*) 70 - 99 (mg/dL)  GLUCOSE, CAPILLARY     Status: Abnormal   Collection Time   04/26/11  1:04 AM      Component Value Range   Glucose-Capillary 143 (*) 70 - 99 (mg/dL)   Comment 1 Documented in Chart     Comment 2 Notify RN    GLUCOSE, CAPILLARY     Status: Abnormal   Collection Time   04/26/11  5:57 AM      Component Value Range   Glucose-Capillary 101 (*) 70 - 99 (mg/dL)   Comment 1  Documented in Chart     Comment 2 Notify RN    PROTIME-INR     Status: Abnormal   Collection Time   04/26/11  6:00 AM      Component Value Range   Prothrombin Time 31.0 (*) 11.6 - 15.2 (seconds)   INR 2.93 (*) 0.00 - 1.49   GLUCOSE, CAPILLARY     Status: Abnormal   Collection Time   04/26/11  9:50 AM      Component Value Range   Glucose-Capillary 113 (*) 70 - 99 (mg/dL)    Studies/Results: Dg Chest 2 View  04/24/2011  *RADIOLOGY REPORT*  Clinical Data: Sepsis.  Positive blood culture.  CHEST - 2 VIEW  Comparison: 03/13 and 04/22/2011  Findings: The patient has new small bilateral pleural effusions. Marked cardiomegaly.  Pulmonary vascularity is normal.  No consolidative infiltrates or effusions.  No acute osseous abnormality.  IMPRESSION: New small bilateral effusions.  No pulmonary infiltrates.  Original Report Authenticated By: Gwynn Burly, M.D.   Dg Chest 2 View  04/22/2011  *RADIOLOGY REPORT*  Clinical Data: History of recent pneumonia, cough, dialysis patient  CHEST - 2 VIEW  Comparison: None.  Findings: There is moderate cardiomegaly present.  Pericardial effusion cannot be excluded.  There do appear to be tiny pleural effusions present which may indicate a degree of failure. Mild volume loss is noted at the right lung base.  No focal infiltrate is seen. The bones are osteopenic and the descending thoracic aorta is ectatic.  IMPRESSION: Moderate cardiomegaly.  Cannot exclude pericardial effusion.  Also question mild CHF with probable small effusions.  Original Report Authenticated By: Juline Patch, M.D.   Ct Head Wo Contrast  04/23/2011  *RADIOLOGY REPORT*  Clinical Data: Altered mental status  CT HEAD WITHOUT CONTRAST  Technique:  Contiguous axial images were obtained from the base of the skull through the vertex without contrast.  Comparison: None.  Findings: Global atrophy.  Chronic ischemic changes. Encephalomalacia in the right occipital lobe has a chronic appearance.  No mass  effect, midline shift, or acute intracranial hemorrhage. Mastoid air cells are clear.  Cranium is intact.  IMPRESSION: Chronic ischemic changes and atrophy.  No acute intracranial pathology.  Original Report Authenticated By: Donavan Burnet, M.D.   Dg Chest Portable 1 View  04/23/2011  *RADIOLOGY REPORT*  Clinical Data: Back pain  PORTABLE CHEST - 1 VIEW  Comparison: Plain film 04/22/2011  Findings: Patient rotated leftward.  Cardiac silhouette is enlarged.  There is increased interstitial edema pattern compared to prior.  Lung bases are poorly evaluated.  IMPRESSION: Cardiomegaly and increased interstitial edema.  Low lung volumes.  Original Report Authenticated By: Genevive Bi, M.D.    Scheduled Meds:   . acetaminophen      .  ceFAZolin (ANCEF) IV  1 g Intravenous Daily  . cinacalcet  30 mg Oral 3 times weekly  . darbepoetin      . darbepoetin  60 mcg Intravenous Q Fri-HD  . diltiazem  30 mg Oral Q6H  . ferric gluconate (FERRLECIT/NULECIT) IV  62.5 mg Intravenous Weekly  . mulitivitamin with minerals  1 tablet Oral Daily  . pantoprazole  40 mg Oral Q2000  . paricalcitol      . paricalcitol  3 mcg Intravenous 3 times weekly  . Warfarin - Pharmacist Dosing Inpatient   Does not apply q1800  . DISCONTD: ferric gluconate (FERRLECIT/NULECIT) IV  62.5 mg Intravenous Weekly  . DISCONTD: pantoprazole (PROTONIX) IV  40 mg Intravenous QHS  . DISCONTD: piperacillin-tazobactam (ZOSYN)  IV  2.25 g Intravenous Q8H  . DISCONTD: vancomycin  750 mg Intravenous Q M,W,F-HD   Continuous Infusions:   . DISCONTD: sodium chloride 50 mL/hr at 04/24/11 1700   PRN Meds:acetaminophen, acetaminophen, ondansetron (ZOFRAN) IV, ondansetron  Assessment/Plan: Miranda Cook is an 78 year old African-American female with a history of AF on home warfarin, T2DM, HTN, and ESRD on MWF HD presenting with delirium 2/2 bacteremia/right anterior tibia cellulitis.   ID Group G streptococcus bacteremia with  sepsis--thought 2/2 right anterior tibia cellulitis. 03/13 (admission) BCx x2 positive group G strep; 3/14 BCx x2 (after starting antibiotics) pending Right anterior tibia cellulitis--patient had a fall in January with right anterior tibia hematoma and s/p I&D at [facility] (patient moved to [place] to live with granddaughter 3-weeks ago) and now has a wound vacuum over area. [facility]) currently monitoring wound, and patient also goes to the wound center.  Symptomatically improving (resolved delirium). WBC had been trending down and now normal today at 7.7. However, right leg still erythematous, tender, and with odor.  DVT U/S 03/15 negative DVT -Follow-up 03/13 BCx sensitivities -Follow-up 03/14 BCx. Will be 48-hours this afternoon 1700.  -Continue Ancef until  sensitivities -Will discuss with attending regarding surgery versus wound care consultation. Seen by [facility] 03/14 and no new recommendations made at that time.   PSYCH Delirium--resolved History of dementia  CV Atrial fibrillation on home warfarin History of hypertension -BP stable. HR 90-100s. -Diltiazem 30 q6 hours (half home dose)>>>continue, holding home bystolic 20 qhs and hydralazine 25 tid -Warfarin per pharmacy. INR 2.93 today. Going down now that infection being treated.   ENDO History of T2DM, diet-controlled--A1c 5.5 -Glc controlled. SSI.  RENAL ESRD on MWF HD -Per renal  FEN/GI -IVF @ [facility] -Diet: regular>>>renal diet  PPx -DVT PPx: warfarin (AF) -SUP: Protonix 40 PO qd  Disposition: pending clinical improvement and negative repeat BCx x 48 hours.   CODE: DNR/DNI    LOS: 3 days   OH PARK, Fahim Kats

## 2011-04-26 NOTE — Progress Notes (Signed)
I interviewed and examined this patient and discussed the care plan with Dr. Madolyn Frieze and the [facility] team and agree with assessment and plan as documented in the progress note for today. Since her grand daughter no longer works in [employer] and they live on [address], she is considering bringing her to [facility] for PCP. Her cardiologist will be at [facility].    Tresten Pantoja A. Sheffield Slider, MD Family Medicine Teaching Service Attending  04/26/2011 11:11 AM

## 2011-04-26 NOTE — Progress Notes (Signed)
  ANTICOAGULATION CONSULT NOTE - Follow Up Consult  Pharmacy Consult for Coumadin  Indication: atrial fibrillation  Allergies  Allergen Reactions  . Avandia (Rosiglitazone Maleate)   . Clonidine Derivatives Other (See Comments)    unknown  . Hctz (Hydrochlorothiazide) Other (See Comments)    unknown  . Lactose Intolerance (Gi)   . Lisinopril Other (See Comments)    unknown  . Tekturna (Aliskiren Fumarate) Other (See Comments)    unknown    Patient Measurements: Height: 5\' 3"  (160 cm) Weight: 139 lb 8.8 oz (63.3 kg) IBW/kg (Calculated) : 52.4  Heparin Dosing Weight:   Vital Signs: Temp: 97.8 F (36.6 C) (03/16 0827) Temp src: Oral (03/16 0827) BP: 121/73 mmHg (03/16 0827) Pulse Rate: 94  (03/16 0827)  Labs:  Basename 04/26/11 0600 04/25/11 0430 04/24/11 2147 04/24/11 1343 04/24/11 0530 04/23/11 1605  HGB -- 10.5* -- -- 9.9* --  HCT -- 31.3* -- -- 29.9* 32.9*  PLT -- 136* -- -- 142* 153  APTT -- -- -- -- -- --  LABPROT 31.0* 52.8* -- -- 46.4* --  INR 2.93* 5.78* -- -- 4.90* --  HEPARINUNFRC -- -- -- -- -- --  CREATININE -- 4.08* -- -- 3.55* 2.68*  CKTOTAL -- -- 50 20 22 --  CKMB -- -- 1.0 1.0 1.0 --  TROPONINI -- -- <0.30 <0.30 <0.30 --   Estimated Creatinine Clearance: 8.7 ml/min (by C-G formula based on Cr of 4.08).  Assessment: 15 yoF on Coumadin PTA for Afib.  INR has been supratherapeutic up to 5.78 on 03/15, likely 2/2 acute illness.  Vitamin K 2.5mg  PO given 03/15 and INR has fallen 5.78-->2.93.  Hgb, Plts low, stable.  No bleeding noted.  Will hold coumadin tonight to see if INR continues to rise or begins to trend down.   Goal of Therapy:  INR 2-3   Plan:  Hold Coumadin.  F/u daily INR, CBC.    Jentri Aye E 04/26/2011,9:10 AM

## 2011-04-26 NOTE — Progress Notes (Signed)
Subjective:  Complains of pain in R leg and foot, not severe but moderate. No other complaints. Objective:    Vital signs in last 24 hours: Filed Vitals:   04/26/11 0300 04/26/11 0400 04/26/11 0500 04/26/11 0827  BP: 109/82  115/46 121/73  Pulse: 92 96 95 94  Temp:   97.8 F (36.6 C) 97.8 F (36.6 C)  TempSrc:   Oral Oral  Resp: 15 18 17 18   Height:      Weight:      SpO2: 96% 98% 98% 94%   Weight change:   Intake/Output Summary (Last 24 hours) at 04/26/11 1012 Last data filed at 04/26/11 0900  Gross per 24 hour  Intake    800 ml  Output   3000 ml  Net  -2200 ml   Labs: Basic Metabolic Panel:  Lab 04/25/11 1610 04/24/11 0530 04/23/11 1605  NA 133* 133* 133*  K 4.4 4.5 5.5*  CL 97 99 95*  CO2 25 27 26   GLUCOSE 108* 119* 116*  BUN 31* 20 15  CREATININE 4.08* 3.55* 2.68*  ALB -- -- --  CALCIUM 9.4 8.9 9.6  PHOS -- 3.6 --   Liver Function Tests:  Lab 04/24/11 0530 04/23/11 1605  AST 30 62*  ALT 14 22  ALKPHOS 58 70  BILITOT 1.2 1.7*  PROT 6.6 8.9*  ALBUMIN 2.2* 2.9*   CBC:  Lab 04/25/11 0430 04/24/11 0530 04/23/11 1605  WBC 12.8* 15.7* 11.2*  NEUTROABS 9.4* -- 9.6*  HGB 10.5* 9.9* 11.4*  HCT 31.3* 29.9* 32.9*  MCV 76.5* 76.9* 74.9*  PLT 136* 142* 153   Cardiac Enzymes:  Lab 04/24/11 2147 04/24/11 1343 04/24/11 0530 04/23/11 1607  CKTOTAL 50 20 22 34  CKMB 1.0 1.0 1.0 1.0  CKMBINDEX -- -- -- --  TROPONINI <0.30 <0.30 <0.30 <0.30   CBG:  Lab 04/26/11 0950 04/26/11 0557 04/26/11 0104 04/25/11 1522 04/25/11 1139  GLUCAP 113* 101* 143* 155* 117*    Iron Studies: No results found for this basename: IRON:30,TIBC:30,SATURATION RATIOS:30,TRANSFERRIN:30,FERRITIN:30 in the last 168 hours  Medications:    . DISCONTD: sodium chloride 50 mL/hr at 04/24/11 1700      . acetaminophen      .  ceFAZolin (ANCEF) IV  1 g Intravenous Daily  . cinacalcet  30 mg Oral 3 times weekly  . darbepoetin      . darbepoetin  60 mcg Intravenous Q Fri-HD  . diltiazem   30 mg Oral Q6H  . ferric gluconate (FERRLECIT/NULECIT) IV  62.5 mg Intravenous Weekly  . mulitivitamin with minerals  1 tablet Oral Daily  . pantoprazole  40 mg Oral Q2000  . paricalcitol      . paricalcitol  3 mcg Intravenous 3 times weekly  . Warfarin - Pharmacist Dosing Inpatient   Does not apply q1800  . DISCONTD: ferric gluconate (FERRLECIT/NULECIT) IV  62.5 mg Intravenous Weekly  . DISCONTD: pantoprazole (PROTONIX) IV  40 mg Intravenous QHS  . DISCONTD: piperacillin-tazobactam (ZOSYN)  IV  2.25 g Intravenous Q8H  . DISCONTD: vancomycin  750 mg Intravenous Q M,W,F-HD    I  have reviewed scheduled and prn medications.  Physical Exam:  Blood pressure 121/73, pulse 94, temperature 97.8 F (36.6 C), temperature source Oral, resp. rate 18, height 5\' 3"  (1.6 m), weight 63.3 kg (139 lb 8.8 oz), SpO2 94.00%.  Gen:  Alert, no distress Neck: no JVD Cor: regular, no rub or S3 Chest: clear bilat Abdominal: soft, nontender Neurological: pleasant, nonfocal Ext- right  leg with wound vac in place, some darkish discoloration of R leg below wound, also has diffuse and confluent mildly tender erythema of R leg and foot below wound, also + odor unchanged over wound Access: LUA AVF  Dialysis- Done at [facility], MWF, Formula: NA-137, CA- 2.25, K- 4.0, DFR- 800, BFR- 350. She is on Epogen 10,000 units 3x week, Zemplar 3 mcg 3x week, Venofer 50 mg 1 x week-W.  Problem/Plan: 1. AMS: resolved, pt at baseline with mild dementia per family.  Head CT was neg. 2. Bacteremia- group G strep.  Most likely source is R leg.  On Ancef IV 1 gm daily. Fever better, but leg is red and painful, +odor over wound - would recommend having surgery evaluate the leg and the wound, see if it needs any further debridement, etc. Will d/w primary MD. 3. ESRD / MWF  - dialysis yesterday without incident.  4. Right leg wound: s/p fall with hematoma that required I&D, now with large wound and wound vac, ? Source  of infection.  5. Afib RVR: off IV dilt, on coumadin, po dilt 6. Hypertension- takes cardizem, bystolic and hydralazine at home, on po diltiazem here only for now, BP 7. Dementia: Mild, not current treatment. Pt functional before this event.  8. Hypoalbuminemia- last albumin was 2.2, on a regular diet, consider switching to renal diet  9. Anemia- HGB 9.9, RDW 18.5, continue ESA at outpt. Not on IV iron.  10. Thrombocytopenia- PLT 142  11. MBD- taking Sensipar tiw at home, Zemplar 3 ug, no binders.  Vinson Moselle  MD [facility] [phone] pgr    [phone] cell 04/26/2011, 10:12 AM

## 2011-04-27 DIAGNOSIS — I4891 Unspecified atrial fibrillation: Secondary | ICD-10-CM | POA: Diagnosis present

## 2011-04-27 DIAGNOSIS — N186 End stage renal disease: Secondary | ICD-10-CM | POA: Diagnosis present

## 2011-04-27 DIAGNOSIS — E119 Type 2 diabetes mellitus without complications: Secondary | ICD-10-CM | POA: Diagnosis present

## 2011-04-27 DIAGNOSIS — I10 Essential (primary) hypertension: Secondary | ICD-10-CM | POA: Diagnosis present

## 2011-04-27 DIAGNOSIS — Z992 Dependence on renal dialysis: Secondary | ICD-10-CM | POA: Diagnosis present

## 2011-04-27 DIAGNOSIS — S81801A Unspecified open wound, right lower leg, initial encounter: Secondary | ICD-10-CM | POA: Diagnosis present

## 2011-04-27 DIAGNOSIS — R7881 Bacteremia: Secondary | ICD-10-CM | POA: Diagnosis present

## 2011-04-27 DIAGNOSIS — Z7901 Long term (current) use of anticoagulants: Secondary | ICD-10-CM

## 2011-04-27 DIAGNOSIS — F0390 Unspecified dementia without behavioral disturbance: Secondary | ICD-10-CM | POA: Diagnosis present

## 2011-04-27 DIAGNOSIS — B955 Unspecified streptococcus as the cause of diseases classified elsewhere: Secondary | ICD-10-CM

## 2011-04-27 DIAGNOSIS — I071 Rheumatic tricuspid insufficiency: Secondary | ICD-10-CM | POA: Diagnosis present

## 2011-04-27 HISTORY — DX: Unspecified streptococcus as the cause of diseases classified elsewhere: B95.5

## 2011-04-27 LAB — PROTIME-INR: INR: 2.24 — ABNORMAL HIGH (ref 0.00–1.49)

## 2011-04-27 MED ORDER — NEBIVOLOL HCL 10 MG PO TABS
10.0000 mg | ORAL_TABLET | Freq: Every day | ORAL | Status: DC
  Administered 2011-04-27: 10 mg via ORAL
  Filled 2011-04-27: qty 1

## 2011-04-27 MED ORDER — CEPHALEXIN 500 MG PO CAPS: 500.0000 mg | ORAL_CAPSULE | Freq: Three times a day (TID) | ORAL | Status: DC

## 2011-04-27 MED ORDER — CEPHALEXIN 500 MG PO CAPS
500.0000 mg | ORAL_CAPSULE | Freq: Three times a day (TID) | ORAL | Status: DC
  Administered 2011-04-27: 500 mg via ORAL
  Filled 2011-04-27 (×3): qty 1

## 2011-04-27 MED ORDER — WARFARIN SODIUM 2.5 MG PO TABS
2.5000 mg | ORAL_TABLET | Freq: Once | ORAL | Status: DC
  Filled 2011-04-27: qty 1

## 2011-04-27 NOTE — Progress Notes (Signed)
  ANTICOAGULATION CONSULT NOTE - Follow Up Consult  Pharmacy Consult for Coumadin  Indication: atrial fibrillation  Allergies  Allergen Reactions  . Avandia (Rosiglitazone Maleate)   . Clonidine Derivatives Other (See Comments)    unknown  . Hctz (Hydrochlorothiazide) Other (See Comments)    unknown  . Lactose Intolerance (Gi)   . Lisinopril Other (See Comments)    unknown  . Tekturna (Aliskiren Fumarate) Other (See Comments)    unknown    Patient Measurements: Height: 5\' 3"  (160 cm) Weight: 132 lb 11.5 oz (60.2 kg) IBW/kg (Calculated) : 52.4  Heparin Dosing Weight:   Vital Signs: Temp: 98.5 F (36.9 C) (03/17 0815) Temp src: Oral (03/17 0815) BP: 132/85 mmHg (03/17 0815) Pulse Rate: 82  (03/17 0815)  Labs:  Basename 04/27/11 0847 04/26/11 1028 04/26/11 0600 04/25/11 0430 04/24/11 2147 04/24/11 1343  HGB -- 10.7* -- 10.5* -- --  HCT -- 31.4* -- 31.3* -- --  PLT -- 174 -- 136* -- --  APTT -- -- -- -- -- --  LABPROT 25.2* -- 31.0* 52.8* -- --  INR 2.24* -- 2.93* 5.78* -- --  HEPARINUNFRC -- -- -- -- -- --  CREATININE -- 2.75* -- 4.08* -- --  CKTOTAL -- -- -- -- 50 20  CKMB -- -- -- -- 1.0 1.0  TROPONINI -- -- -- -- <0.30 <0.30   Estimated Creatinine Clearance: 11.9 ml/min (by C-G formula based on Cr of 2.75).  Assessment: 39 yoF on Coumadin PTA for Afib.  INR has been supratherapeutic up to 5.78 on 03/15, likely 2/2 acute illness.  Vitamin K 2.5mg  PO given 03/15.  INR held 03/16 to assess  -fell 2.93-->2.24.  Will restart conservative coumadin dosing.  Hgb, Plts low, stable.  No bleeding noted.   Home Coumadin dose: 2.5mg  MWF, 4mg  all other days.    Goal of Therapy:  INR 2-3   Plan:  Coumadin 2.5mg  x 1.   F/u daily INR, CBC.    Adalea Handler E 04/27/2011,10:12 AM

## 2011-04-27 NOTE — Discharge Summary (Signed)
Physician Discharge Summary  Patient ID: Miranda Cook MRN: [id number] DOB/AGE: 76/10/1922 76 y.o.  Admit date: 04/23/2011 Discharge date: 04/27/2011  Admission Diagnoses: Patient Active Problem List  Diagnoses  . Bacteremia-sepsis due to Streptococcus  . ESRD (end stage renal disease) on dialysis  . Diabetes mellitus type 2 in nonobese  . Hypertension  . Leg wound, right  . Dementia  . Atrial fibrillation  . Anticoagulated on Coumadin  . Tricuspid regurgitation   Delirium  Discharge Diagnoses:  Active Problems:  Bacteremia due to Streptococcus  ESRD (end stage renal disease) on dialysis  Diabetes mellitus type 2 in nonobese  Hypertension  Leg wound, right  Dementia  Atrial fibrillation  Anticoagulated on Coumadin  Tricuspid regurgitation   Discharged Condition: fair  Hospital Course: 78 yo female with ESRD and history of right leg wound s/p evacuation of tibial hematoma with wound vac presented with AMS and sepsis secondary to Group G strep bacteremia.   1. Bacteremia. On admission patient was hypotensive and tachycardic. Blood culture shortly resulted gram-positive cocci and speciated to group G. Streptococcus sensitive to penicillin. Antibiotics were targeted from the vancomycin and Zosyn to Ancef. Patient then transition to oral Keflex to finish 14 day course. Likely source with skin contamination from chronic right tibial wound. Wound care was consulted and found the healing appropriately. Continued wound VAC during hospitalization. Patient notably complained of leg discomfort however this resolved with resolution of edema with hemodialysis. Repeat blood cultures from 3/14 had no additional growth.  2. Right leg wound. Did not appear infected during hospitalization. Patient will continue wound VAC and home health to follow. Has appointment with wound care center in 2 days.  3. A. fib on anticoagulation. The patient was tachycardic initially however this resolved with  antibiotic treatment of her sepsis. Her diltiazem was continued at half dose and increase to full dose at time of discharge. Notably her INR was elevated up to 4-5 range and Coumadin held. Received 2.5 mg vitamin K and INR was 2.8 on day of discharge. Patient will continue Coumadin at 2.5 mg daily with recheck in 2 days planned. Home health consult to and patient also advised to return to family practice Center within 3 days for check. Cardiology followup arranged.  4. Hypertension. Again patient's blood pressures were low with sepsis. The tach resolved at time of discharge and was restarted on diltiazem and bysystolic. Hydralazine 3 times a day was held and this can be restarted as outpatient.  5. Delirium. Improved after treatment of infection. The patient resolved to her baseline of Dementia prior to DC.   6. ESRD. Continued on normal schedule. Mild volume increase due to resuscitation with sepsis, patient appears quite dry on discharge exam, no sign volume overload.  Consults: nephrology  Significant Diagnostic Studies: 1.Echocardiogram: Left ventricle: The cavity size was normal. Systolicfunction was vigorous. The estimated ejection fraction was 65% to 70%. Wall motion was normal; there were no regional wall motion abnormalities. - Mitral valve: Moderately calcified annulus. Moderate tosevere regurgitation. - Left atrium: The atrium was severely dilated. - Right ventricle: The cavity size was moderately dilated. Wall thickness was normal. - Right atrium: The atrium was massively dilated. - Tricuspid valve: Moderate-severe regurgitation. - Pulmonary arteries: Systolic pressure was moderately increased. PA peak pressure: 56mm Hg (S).  2. LE doppler: negative for DVT.  3. CT Head negative.  Treatments: IV hydration, antibiotics: Ancef, anticoagulation: coumadin held due to supratherapeutic and dialysis: Hemodialysis (Last on Friday)  Discharge Exam: Blood pressure 155/90,  pulse 100,  temperature 98.3 F (36.8 C), temperature source Oral, resp. rate 21, height 5\' 3"  (1.6 m), weight 132 lb 11.5 oz (60.2 kg), SpO2 96.00%. Gen: NAD, sitting up in bed eating breakfast  Psych: alert, normal speech  CV: irregularly irregular, II/VI sytolic murmur at LLSB  Pulm: CTAB without w/r/r; decreased BS bilateral bases  Abd: soft, NT  Ext: wound vac over right shin, no calf tenderness or warmth today, edema resolved ; 2+ pedal pulses bilaterally   Follow up Issues:   1. INR check-supratherapeutic during admission, was 2.8 at DC. 2. HTN. Home hydralazine held and other antihypertensives restarted in step wise fashion, hypotension resolved on day of discharge. 3. Wound management. Resume HH RN for wound vac MWF. Patient has appointment at wound center on Tuesday. 4. Disposition. Patient has moved in with Granddaughter Belinda in [place], is establishing local care. Has appt with [facility] cardiology. Have offered to establish with [facility] vs alternative MD of choice, vs return to PCP in [facility] Dr. Tresa Endo. We will arrange hospital f/u next week.  5. ESRD. To resume MWF at [facility] HD. Will forwary copy of DC summary and notify in am.   Disposition: Final discharge disposition not confirmed  Discharge Orders    Future Appointments: Provider: Department: Dept Phone: Center:   05/08/2011 8:15 AM Wendall Stade, MD [facility] [phone] [facility]   05/08/2011 9:30 AM [facility] [phone] None   05/20/2011 8:00 AM [facility] [phone] [facility]     Medication List  As of 04/27/2011  2:18 PM   STOP taking these medications         hydrALAZINE 25 MG tablet         TAKE these medications         BYSTOLIC 20 MG Tabs   Generic drug: Nebivolol HCl   Take 1 tablet by mouth at bedtime.      cephALEXin 500 MG capsule   Commonly known as: KEFLEX   Take 1 capsule (500 mg total) by mouth every 8 (eight) hours.       cinacalcet 30 MG tablet   Commonly known as: SENSIPAR   Take 30 mg by mouth 3 (three) times a week.      diltiazem 240 MG 24 hr capsule   Commonly known as: CARDIZEM CD   Take 240 mg by mouth at bedtime.      mulitivitamin with minerals Tabs   Take 1 tablet by mouth daily.      promethazine 12.5 MG tablet   Commonly known as: PHENERGAN   Take 12.5 mg by mouth every 12 (twelve) hours as needed. For nausea      traMADol 50 MG tablet   Commonly known as: ULTRAM   Take 50 mg by mouth every 6 (six) hours as needed. For pain      warfarin 2.5 MG tablet   Commonly known as: COUMADIN   Take 2.5 mg by mouth every Monday, Wednesday, and Friday.      warfarin 4 MG tablet   Commonly known as: COUMADIN   Take 4 mg by mouth See admin instructions. Take 4 mg on Sunday, Tuesday, Thursday, and Saturday           Follow-up Information    Follow up with Lloyd Huger, MD. Schedule an appointment as soon as possible for a visit in 2 days. (make appointment for hospital follow up)    Contact information:  [address] [place] [address] [phone]       Follow up with [facility]              on 04/29/2011. (Go to appointment as scheduled at wound center)    Contact information:   [address] [place] [address]       Follow up with Have INR/blood checked in 2 days.      Follow up with [facility], MD. Schedule an appointment as soon as possible for a visit in 7 days.   Contact information:   [address] [place] [address] [phone]          Signed: Lloyd Huger PGY-2 04/27/2011, 2:18 PM

## 2011-04-27 NOTE — Progress Notes (Signed)
I interviewed and examined this patient and discussed the care plan with Dr. Cristal Ford and the [facility] team and agree with assessment and plan as documented in the progress note for today.    Miranda Cook A. Sheffield Slider, MD Family Medicine Teaching Service Attending  04/27/2011 6:11 PM

## 2011-04-27 NOTE — Progress Notes (Signed)
Subjective:  No complaints. Objective:    Vital signs in last 24 hours: Filed Vitals:   04/27/11 0028 04/27/11 0442 04/27/11 0500 04/27/11 0815  BP: 138/83 131/61  132/85  Pulse: 103 103  82  Temp: 98.4 F (36.9 C) 98.3 F (36.8 C)  98.5 F (36.9 C)  TempSrc: Oral Oral  Oral  Resp: 20 16  17   Height:      Weight:   60.2 kg (132 lb 11.5 oz)   SpO2: 99% 98%  97%   Weight change: -4.3 kg (-9 lb 7.7 oz)  Intake/Output Summary (Last 24 hours) at 04/27/11 0837 Last data filed at 04/27/11 0600  Gross per 24 hour  Intake    810 ml  Output    100 ml  Net    710 ml   Labs: Basic Metabolic Panel:  Lab 04/26/11 1610 04/25/11 0430 04/24/11 0530 04/23/11 1605  NA 139 133* 133* 133*  K 3.5 4.4 4.5 5.5*  CL 101 97 99 95*  CO2 31 25 27 26   GLUCOSE 107* 108* 119* 116*  BUN 15 31* 20 15  CREATININE 2.75* 4.08* 3.55* 2.68*  ALB -- -- -- --  CALCIUM 8.7 9.4 8.9 9.6  PHOS -- -- 3.6 --   Liver Function Tests:  Lab 04/24/11 0530 04/23/11 1605  AST 30 62*  ALT 14 22  ALKPHOS 58 70  BILITOT 1.2 1.7*  PROT 6.6 8.9*  ALBUMIN 2.2* 2.9*   CBC:  Lab 04/26/11 1028 04/25/11 0430 04/24/11 0530 04/23/11 1605  WBC 7.7 12.8* 15.7* 11.2*  NEUTROABS -- 9.4* -- 9.6*  HGB 10.7* 10.5* 9.9* 11.4*  HCT 31.4* 31.3* 29.9* 32.9*  MCV 75.1* 76.5* 76.9* 74.9*  PLT 174 136* 142* 153   Cardiac Enzymes:  Lab 04/24/11 2147 04/24/11 1343 04/24/11 0530 04/23/11 1607  CKTOTAL 50 20 22 34  CKMB 1.0 1.0 1.0 1.0  CKMBINDEX -- -- -- --  TROPONINI <0.30 <0.30 <0.30 <0.30   CBG:  Lab 04/27/11 0040 04/26/11 2117 04/26/11 1611 04/26/11 1214 04/26/11 0950  GLUCAP 155* 155* 139* 130* 113*    Iron Studies: No results found for this basename: IRON:30,TIBC:30,SATURATION RATIOS:30,TRANSFERRIN:30,FERRITIN:30 in the last 168 hours  Medications:      .  ceFAZolin (ANCEF) IV  1 g Intravenous Daily  . cinacalcet  30 mg Oral 3 times weekly  . darbepoetin      . darbepoetin  60 mcg Intravenous Q Fri-HD  .  diltiazem  30 mg Oral Q6H  . ferric gluconate (FERRLECIT/NULECIT) IV  62.5 mg Intravenous Weekly  . mulitivitamin with minerals  1 tablet Oral Daily  . pantoprazole  40 mg Oral Q2000  . paricalcitol      . paricalcitol  3 mcg Intravenous 3 times weekly  . Warfarin - Pharmacist Dosing Inpatient   Does not apply q1800  . DISCONTD: pantoprazole (PROTONIX) IV  40 mg Intravenous QHS    I  have reviewed scheduled and prn medications.  Physical Exam:  Blood pressure 132/85, pulse 82, temperature 98.5 F (36.9 C), temperature source Oral, resp. rate 17, height 5\' 3"  (1.6 m), weight 60.2 kg (132 lb 11.5 oz), SpO2 97.00%.  Gen:  Alert, no distress, calm Neck: no JVD Cor: regular, no rub or S3 Chest: clear bilat Abdominal: soft, nontender Neurological: pleasant, nonfocal Ext- right leg wound vac in place Access: LUA AVF  Dialysis- Done at [facility], MWF, Formula: NA-137, CA- 2.25, K- 4.0, DFR- 800, BFR- 350. She is  on Epogen 10,000 units 3x week, Zemplar 3 mcg 3x week, Venofer 50 mg 1 x week-W.  Problem/Plan: 1. AMS: resolved, pt at baseline with mild dementia per family.  Head CT was neg. 2. Bacteremia- group G strep, on Ancef. 3. ESRD / MWF  - HD tomorrow 4. Right leg wound: s/p fall with hematoma that required I&D, now with large wound and wound vac 5. Afib RVR: off IV dilt, on coumadin, po dilt 6. Hypertension- takes cardizem, bystolic and hydralazine at home, on po diltiazem here only for now 7. Dementia- mild-mod 8. Anemia- HGB 10's, continue ESA as outpt. Not on IV iron.  9. Thrombocytopenia- PLT 142  10. MBD- taking Sensipar 3x/wk at home, Zemplar 3 ug, no binders.  Vinson Moselle  MD [facility] [phone] pgr    [phone] cell 04/27/2011, 8:37 AM

## 2011-04-27 NOTE — Progress Notes (Signed)
   CARE MANAGEMENT NOTE 04/27/2011  Patient:  Miranda Cook, Miranda Cook   Account Number:  [id number]  Date Initiated:  04/24/2011  Documentation initiated by:  Ronny Flurry  Subjective/Objective Assessment:   DX: AMS, fever, a fib     Action/Plan:   Anticipated DC Date:  04/29/2011   Anticipated DC Plan:  HOME W HOME HEALTH SERVICES      DC Planning Services  CM consult      [facility] Choice  Resumption Of Svcs/PTA Provider   Choice offered to / List presented to:          [facility] arranged  HH-1 RN      [facility] agency  [facility]   Status of service:  Completed, signed off Medicare Important Message given?   (If response is "NO", the following Medicare IM given date fields will be blank) Date Medicare IM given:   Date Additional Medicare IM given:    Discharge Disposition:  HOME W HOME HEALTH SERVICES  Per UR Regulation:  Reviewed for med. necessity/level of care/duration of stay  If discussed at Long Length of Stay Meetings, dates discussed:    Comments:  04/27/2011 1515 Contacted [facility] to make aware of pt's scheduled d/c home today. Pt has wound vac from [facility]. Genevieve Norlander rep states she will have RN go out today to connect pt to wound vac. Faxed orders, facesheet, and H&P to [place]. Isidoro Donning RN CCM Case Mgmt phone [phone]  Pt originally form [place] in dialysis for 2 years. Pt had a hx of a fall back in January with hematoma in Right leg that was drained ( at [facility]) and has been with wound vac for treatment since then. She moved recently (3 weeks ago) with her granddaughter. Goes to [facility] for dialysis, scheduled M//W/F. And has had just 1 appointment with a primary care doctor at [facility] more focused in her cardiac condition.

## 2011-04-27 NOTE — Discharge Summary (Signed)
I interviewed and examined this patient and discussed the care plan with Dr. Cristal Ford and the [facility] team and agree with assessment and plan as documented in the progress note for today.    Cole Klugh A. Sheffield Slider, MD Family Medicine Teaching Service Attending  04/27/2011 6:14 PM

## 2011-04-27 NOTE — Progress Notes (Signed)
Subjective: No complaints except feels like too much fluid taken at HD yesterday. Denies leg pain, feels ready to go home.   Objective: Vital signs in last 24 hours: Temp:  [97.6 F (36.4 C)-98.5 F (36.9 C)] 98.5 F (36.9 C) (03/17 0815) Pulse Rate:  [82-103] 82  (03/17 0815) Resp:  [15-20] 17  (03/17 0815) BP: (125-150)/(61-94) 132/85 mmHg (03/17 0815) SpO2:  [96 %-99 %] 97 % (03/17 0815) Weight:  [132 lb 11.5 oz (60.2 kg)] 132 lb 11.5 oz (60.2 kg) (03/17 0500)  Intake/Output from previous day: 03/16 0701 - 03/17 0700 In: 810 [P.O.:760] Out: 100 [Urine:100] Intake/Output this shift:    Physical exam: Gen: NAD, sitting up in bed eating breakfast Psych: alert, normal speech CV: irregularly irregular, II/VI sytolic murmur at LLSB Pulm: CTAB without w/r/r; decreased BS bilateral bases Abd: soft, NT Ext: wound vac over right shin, no calf tenderness or warmth today, edema resolved ; 2+ pedal pulses bilaterally  Results for orders placed during the hospital encounter of 04/23/11 (from the past 24 hour(s))  GLUCOSE, CAPILLARY     Status: Abnormal   Collection Time   04/26/11  9:50 AM      Component Value Range   Glucose-Capillary 113 (*) 70 - 99 (mg/dL)  CBC     Status: Abnormal   Collection Time   04/26/11 10:28 AM      Component Value Range   WBC 7.7  4.0 - 10.5 (K/uL)   RBC 4.18  3.87 - 5.11 (MIL/uL)   Hemoglobin 10.7 (*) 12.0 - 15.0 (g/dL)   HCT 78.2 (*) 95.6 - 46.0 (%)   MCV 75.1 (*) 78.0 - 100.0 (fL)   MCH 25.6 (*) 26.0 - 34.0 (pg)   MCHC 34.1  30.0 - 36.0 (g/dL)   RDW 21.3 (*) 08.6 - 15.5 (%)   Platelets 174  150 - 400 (K/uL)  BASIC METABOLIC PANEL     Status: Abnormal   Collection Time   04/26/11 10:28 AM      Component Value Range   Sodium 139  135 - 145 (mEq/L)   Potassium 3.5  3.5 - 5.1 (mEq/L)   Chloride 101  96 - 112 (mEq/L)   CO2 31  19 - 32 (mEq/L)   Glucose, Bld 107 (*) 70 - 99 (mg/dL)   BUN 15  6 - 23 (mg/dL)   Creatinine, Ser 5.78 (*) 0.50 - 1.10  (mg/dL)   Calcium 8.7  8.4 - 46.9 (mg/dL)   GFR calc non Af Amer 14 (*) >90 (mL/min)   GFR calc Af Amer 17 (*) >90 (mL/min)  GLUCOSE, CAPILLARY     Status: Abnormal   Collection Time   04/26/11 12:14 PM      Component Value Range   Glucose-Capillary 130 (*) 70 - 99 (mg/dL)  GLUCOSE, CAPILLARY     Status: Abnormal   Collection Time   04/26/11  4:11 PM      Component Value Range   Glucose-Capillary 139 (*) 70 - 99 (mg/dL)  GLUCOSE, CAPILLARY     Status: Abnormal   Collection Time   04/26/11  9:17 PM      Component Value Range   Glucose-Capillary 155 (*) 70 - 99 (mg/dL)  GLUCOSE, CAPILLARY     Status: Abnormal   Collection Time   04/27/11 12:40 AM      Component Value Range   Glucose-Capillary 155 (*) 70 - 99 (mg/dL)  GLUCOSE, CAPILLARY     Status: Abnormal   Collection  Time   04/27/11  8:14 AM      Component Value Range   Glucose-Capillary 103 (*) 70 - 99 (mg/dL)    Studies/Results: Dg Chest 2 View  04/24/2011  *RADIOLOGY REPORT*  Clinical Data: Sepsis.  Positive blood culture.  CHEST - 2 VIEW  Comparison: 03/13 and 04/22/2011  Findings: The patient has new small bilateral pleural effusions. Marked cardiomegaly.  Pulmonary vascularity is normal.  No consolidative infiltrates or effusions.  No acute osseous abnormality.  IMPRESSION: New small bilateral effusions.  No pulmonary infiltrates.  Original Report Authenticated By: Gwynn Burly, M.D.   Dg Chest 2 View  04/22/2011  *RADIOLOGY REPORT*  Clinical Data: History of recent pneumonia, cough, dialysis patient  CHEST - 2 VIEW  Comparison: None.  Findings: There is moderate cardiomegaly present.  Pericardial effusion cannot be excluded.  There do appear to be tiny pleural effusions present which may indicate a degree of failure. Mild volume loss is noted at the right lung base.  No focal infiltrate is seen. The bones are osteopenic and the descending thoracic aorta is ectatic.  IMPRESSION: Moderate cardiomegaly.  Cannot exclude  pericardial effusion.  Also question mild CHF with probable small effusions.  Original Report Authenticated By: Juline Patch, M.D.   Ct Head Wo Contrast  04/23/2011  *RADIOLOGY REPORT*  Clinical Data: Altered mental status  CT HEAD WITHOUT CONTRAST  Technique:  Contiguous axial images were obtained from the base of the skull through the vertex without contrast.  Comparison: None.  Findings: Global atrophy.  Chronic ischemic changes. Encephalomalacia in the right occipital lobe has a chronic appearance.  No mass effect, midline shift, or acute intracranial hemorrhage. Mastoid air cells are clear.  Cranium is intact.  IMPRESSION: Chronic ischemic changes and atrophy.  No acute intracranial pathology.  Original Report Authenticated By: Donavan Burnet, M.D.   Dg Chest Portable 1 View  04/23/2011  *RADIOLOGY REPORT*  Clinical Data: Back pain  PORTABLE CHEST - 1 VIEW  Comparison: Plain film 04/22/2011  Findings: Patient rotated leftward.  Cardiac silhouette is enlarged.  There is increased interstitial edema pattern compared to prior.  Lung bases are poorly evaluated.  IMPRESSION: Cardiomegaly and increased interstitial edema.  Low lung volumes.  Original Report Authenticated By: Genevive Bi, M.D.    Scheduled Meds:    .  ceFAZolin (ANCEF) IV  1 g Intravenous Daily  . cinacalcet  30 mg Oral 3 times weekly  . darbepoetin      . darbepoetin  60 mcg Intravenous Q Fri-HD  . diltiazem  30 mg Oral Q6H  . ferric gluconate (FERRLECIT/NULECIT) IV  62.5 mg Intravenous Weekly  . mulitivitamin with minerals  1 tablet Oral Daily  . pantoprazole  40 mg Oral Q2000  . paricalcitol      . paricalcitol  3 mcg Intravenous 3 times weekly  . Warfarin - Pharmacist Dosing Inpatient   Does not apply q1800  . DISCONTD: pantoprazole (PROTONIX) IV  40 mg Intravenous QHS   Continuous Infusions:  PRN Meds:acetaminophen, acetaminophen, ondansetron (ZOFRAN) IV, ondansetron  Assessment/Plan: Miranda Cook is an  78 year old African-American female with a history of AF on home warfarin, T2DM, HTN, and ESRD on MWF HD presenting with delirium 2/2 bacteremia/right anterior tibia cellulitis.   ID Group G streptococcus bacteremia with sepsis--resolved sepsis now. Thought 2/2 right anterior tibia cellulitis. BCx x2 positive group G strep; 3/14 BCx x2 (after starting antibiotics) negative to date. Right anterior tibia cellulitis--patient had a fall in January with  right anterior tibia hematoma and s/p I&D at [facility] (patient moved to [place] to live with granddaughter 3-weeks ago) and now has a wound vacuum over area. [facility]) currently monitoring wound, and patient also goes to the wound center.  Symptomatically improving (resolved delirium). WBC had been trending down and now normal today at 7.7. However, right leg does not appear to have cellulitis, wound vac in place.  DVT U/S 03/15 negative DVT -03/13 BCx sensitive to PCN. Will transition ancef to keflex.  -Follow-up 03/14 BCx. Now neg x48 hours.  -Wound care consultation on 03/14, wound appears to be healing appropriately and no new recommendations made at that time.  -Will continue [facility] wound care after DC.   PSYCH Delirium--resolved History of dementia  CV Atrial fibrillation on home warfarin History of hypertension -BP stable. HR 90-100s. -Diltiazem 30 q6 hours (half home dose)>>>home bystolic 20 qhs and hydralazine 25 tid held for low BP, now have risen again, will restart home meds. -Warfarin per pharmacy. INR 2.93 today. Will resume coumadin at home.  ENDO History of T2DM, diet-controlled--A1c 5.5 -Glc controlled. SSI.  RENAL ESRD on MWF HD -Per renal, negative 3L yesterday, borderline low K after HD.   FEN/GI -IVF @ [facility] -Diet: regular>>>renal diet  PPx -DVT PPx: warfarin (AF) -SUP: Protonix 40 PO qd  Disposition: pending clinical improvement and negative repeat BCx x 48 hours. Possibly home with [facility] today.  Will discuss with family. Plan to resume HD tomorrow as scheduled as outpatient. Will need INR followup and clinic follow up early next week at [facility] if to change providers.  CODE: DNR/DNI    LOS: 4 days   Tyrin Herbers

## 2011-04-27 NOTE — Discharge Instructions (Signed)
Your infection is being treated with antibiotic called keflex, this has been sent to the pharmacy [facility]. OK to restart your other medications prescribed by your medical doctor, except hydralazine (for high blood pressure) because your blood pressure was low this week. Make sure to get to Wound Center on Tuesday as scheduled, resume dialysis as scheduled. Make an appointment with your primary doctor (or at [facility]-Dr. Cristal Ford) for check up within one week, we can check your blood thinning level. You should take only 2.5 mg (half of 5mg  tablet) for next several days as your blood was too thin during hospital stay.  Have your blood INR/coumadin/protime checked early next week.

## 2011-05-01 ENCOUNTER — Ambulatory Visit (INDEPENDENT_AMBULATORY_CARE_PROVIDER_SITE_OTHER): Payer: Medicare Other | Admitting: Family Medicine

## 2011-05-01 VITALS — BP 135/79 | HR 89 | Temp 97.7°F | Wt 134.8 lb

## 2011-05-01 DIAGNOSIS — S81801A Unspecified open wound, right lower leg, initial encounter: Secondary | ICD-10-CM

## 2011-05-01 DIAGNOSIS — Z5181 Encounter for therapeutic drug level monitoring: Secondary | ICD-10-CM

## 2011-05-01 DIAGNOSIS — M109 Gout, unspecified: Secondary | ICD-10-CM

## 2011-05-01 DIAGNOSIS — A491 Streptococcal infection, unspecified site: Secondary | ICD-10-CM

## 2011-05-01 DIAGNOSIS — I10 Essential (primary) hypertension: Secondary | ICD-10-CM

## 2011-05-01 DIAGNOSIS — Z7901 Long term (current) use of anticoagulants: Secondary | ICD-10-CM

## 2011-05-01 DIAGNOSIS — B955 Unspecified streptococcus as the cause of diseases classified elsewhere: Secondary | ICD-10-CM

## 2011-05-01 DIAGNOSIS — R7881 Bacteremia: Secondary | ICD-10-CM

## 2011-05-01 DIAGNOSIS — E119 Type 2 diabetes mellitus without complications: Secondary | ICD-10-CM

## 2011-05-01 DIAGNOSIS — I4891 Unspecified atrial fibrillation: Secondary | ICD-10-CM

## 2011-05-01 DIAGNOSIS — S91009A Unspecified open wound, unspecified ankle, initial encounter: Secondary | ICD-10-CM

## 2011-05-01 LAB — CULTURE, BLOOD (ROUTINE X 2)
Culture  Setup Time: 201303150131
Culture  Setup Time: 201303150131
Culture: NO GROWTH

## 2011-05-01 NOTE — Patient Instructions (Signed)
Continue taking keflex antibiotic until finished. OK to stop hydralazine as your blood pressure is ok today. So glad wound vac is off! Make an appointment in 1- 2 months for check up, may discuss gout at next visit. Fill out new patient paperwork.

## 2011-05-02 ENCOUNTER — Encounter: Payer: Self-pay | Admitting: Family Medicine

## 2011-05-02 DIAGNOSIS — M109 Gout, unspecified: Secondary | ICD-10-CM | POA: Insufficient documentation

## 2011-05-02 NOTE — Assessment & Plan Note (Signed)
Seeing wound care weekly, improving. Wound vac therapy completed. Will continue keflex for 5 more days.

## 2011-05-02 NOTE — Assessment & Plan Note (Signed)
To initiate with [facility] cardiology as [place]. Will continue coumadin anticoagulation. Rate control with diltiazem and bystolic.

## 2011-05-02 NOTE — Assessment & Plan Note (Signed)
At goal today. Will continue off hydralazine unless control worsens, may be titrated with her volume status at dialysis.

## 2011-05-02 NOTE — Assessment & Plan Note (Signed)
No treatment needed as she is well below goal.

## 2011-05-02 NOTE — Assessment & Plan Note (Signed)
Unsure why medication discontinued by previous provider, but current mild swelling likely related to injury. Will follow up at next visit and discuss need for prophylaxis as no clear recent exacerbations.

## 2011-05-02 NOTE — Progress Notes (Signed)
  Subjective:    Patient ID: Miranda Cook, female    DOB: Feb 26, 1922, 78 y.o.   MRN: [id number]  HPI Hospital follow up visit 3/13-3/17. Patient requests change in PCP to [facility] (saw provider in [place] one time since move from [place]).   1. Treated for Group G strep bacteremia, sepsis related to likely leg cellulitis from healing hematoma wound. Has continued on Keflex as directed. Patient feeling much more energetic and improved. No further episodes altered metal status. Wound VAC has been this continued, patient still following at wound care center every Tuesday and doing well.  2. Atrial fibrillation. Patient had initial visit scheduled at [facility] next week to establish cardiologist. Has been on Coumadin anticoagulation for years. Had supratherapeutic INR associated with sepsis and need to recheck today. Taking 2.5 mg every other day and 4 mg every other day. Denied abnormal bleeding or bruising. Denies palpitations or chest pain.  3. history of gout. Patient previously treated for this prophylactically. Her medication was discontinued for unknown reasons to her. Had some right pain intermittently associated with mild swelling, however unsure if this is related to recent leg injury per above. Denies exacerbation, fever, worsened pain.  4. Hypertension. Patient continues on her Cardizem, bystolic. Her hydralazine was held after hospital discharge due to low blood pressure. Denies syncope, weakness, visual changes.  Review of Systems See HPI otherwise negative. SH updated.    Objective:   Physical Exam  Vitals reviewed. Constitutional: She is oriented to person, place, and time. She appears well-developed and well-nourished. No distress.       Appears more energetic than in hospital.  HENT:  Head: Normocephalic and atraumatic.  Eyes: Pupils are equal, round, and reactive to light.  Cardiovascular: Normal rate.   Murmur heard.      Irregularly irregular  Pulmonary/Chest: Effort normal.  No respiratory distress. She has no wheezes.  Musculoskeletal:       Right leg in unna boot.   Neurological: She is alert and oriented to person, place, and time. She exhibits normal muscle tone. Coordination normal.       In wheelchair  Skin: No rash noted.  Psychiatric: She has a normal mood and affect.       Assessment & Plan:

## 2011-05-02 NOTE — Assessment & Plan Note (Signed)
Will complete course of keflex, clinically much better. Leg wound healing.

## 2011-05-06 LAB — GLUCOSE, CAPILLARY: Glucose-Capillary: 65 mg/dL — ABNORMAL LOW (ref 70–99)

## 2011-05-08 ENCOUNTER — Ambulatory Visit (INDEPENDENT_AMBULATORY_CARE_PROVIDER_SITE_OTHER): Payer: Medicare Other | Admitting: *Deleted

## 2011-05-08 ENCOUNTER — Ambulatory Visit (INDEPENDENT_AMBULATORY_CARE_PROVIDER_SITE_OTHER): Payer: Medicare Other | Admitting: Cardiovascular Disease

## 2011-05-08 VITALS — BP 130/74 | HR 64 | Ht 63.0 in | Wt 134.0 lb

## 2011-05-08 DIAGNOSIS — Z7901 Long term (current) use of anticoagulants: Secondary | ICD-10-CM

## 2011-05-08 DIAGNOSIS — B955 Unspecified streptococcus as the cause of diseases classified elsewhere: Secondary | ICD-10-CM

## 2011-05-08 DIAGNOSIS — Z992 Dependence on renal dialysis: Secondary | ICD-10-CM

## 2011-05-08 DIAGNOSIS — I4891 Unspecified atrial fibrillation: Secondary | ICD-10-CM

## 2011-05-08 DIAGNOSIS — R7881 Bacteremia: Secondary | ICD-10-CM

## 2011-05-08 DIAGNOSIS — N186 End stage renal disease: Secondary | ICD-10-CM

## 2011-05-08 DIAGNOSIS — Z5181 Encounter for therapeutic drug level monitoring: Secondary | ICD-10-CM

## 2011-05-08 DIAGNOSIS — I10 Essential (primary) hypertension: Secondary | ICD-10-CM

## 2011-05-08 DIAGNOSIS — A491 Streptococcal infection, unspecified site: Secondary | ICD-10-CM

## 2011-05-08 LAB — POCT INR: INR: 2.9

## 2011-05-08 NOTE — Assessment & Plan Note (Signed)
Scheduled as a new patient in coumadin clinic today but should be able to have it checked at dialysis

## 2011-05-08 NOTE — Assessment & Plan Note (Signed)
Good rate control and anticoagulation  

## 2011-05-08 NOTE — Patient Instructions (Signed)
Your physician wants you to follow-up in:  6 MONTHS WITH DR NISHAN  You will receive a reminder letter in the mail two months in advance. If you don't receive a letter, please call our office to schedule the follow-up appointment. Your physician recommends that you continue on your current medications as directed. Please refer to the Current Medication list given to you today. 

## 2011-05-08 NOTE — Assessment & Plan Note (Signed)
Good thrill in fistula.  F/U Dr Eliott Nine

## 2011-05-08 NOTE — Assessment & Plan Note (Signed)
Well controlled.  Continue current medications and low sodium Dash type diet.    

## 2011-05-08 NOTE — Patient Instructions (Signed)
A full discussion of the nature of anticoagulants has been carried out.  A benefit risk analysis has been presented to the patient, so that they understand the justification for choosing anticoagulation at this time. The need for frequent and regular monitoring, precise dosage adjustment and compliance is stressed.  Side effects of potential bleeding are discussed.  The patient should avoid any OTC items containing aspirin or ibuprofen, and should avoid great swings in general diet.  Avoid alcohol consumption. Pt is not new on Coumadin but the above reviewed with pts daughter and instructions to call Coumadin Clinic at [phone] if needed

## 2011-05-08 NOTE — Progress Notes (Signed)
Patient ID: Miranda Cook, female   DOB: 01-Apr-1922, 78 y.o.   MRN: [id number] 78 yo just moved back to this area from [place].  Recent hospitalization for RLE cellulitis.  Still on antibiotics.  Been on coumadin and dialysis over 2 years.  Chronic afib.  No bleeding problems although INR supraRx while on antibiotics in hospital.  No SSCP, palpitations, dyspnea.  Reviewed echo from hospital and EF normal 65%.  Severe atrial enlargement and not a candicate for cardioversion.  No history of CAD.  Can ambulate and do ADL's at home.  Daughter she raised is her caregiver.  No fever legs improving.  LE venous duplex from hospital negative for DVT.  Long history of LE varicosities  ROS: Denies fever, malais, weight loss, blurry vision, decreased visual acuity, cough, sputum, SOB, hemoptysis, pleuritic pain, palpitaitons, heartburn, abdominal pain, melena, lower extremity edema, claudication, or rash.  All other systems reviewed and negative   General: Affect appropriate Elderly black female in wheel chair HEENT: normal Neck supple with no adenopathy JVP normal no bruits no thyromegaly Lungs clear with no wheezing and good diaphragmatic motion Heart:  S1/S2 no murmur,rub, gallop or click PMI normal Abdomen: benighn, BS positve, no tenderness, no AAA no bruit.  No HSM or HJR Distal pulses intact with no bruits Fistula LUE with thrill Plus one bilateral edema varicosities right greater than left Neuro non-focal Skin warm and dry No muscular weakness  Medications Current Outpatient Prescriptions  Medication Sig Dispense Refill  . cephALEXin (KEFLEX) 500 MG capsule Take 500 mg by mouth every 8 (eight) hours. As direceted      . cinacalcet (SENSIPAR) 30 MG tablet Take 30 mg by mouth 3 (three) times a week.      . diltiazem (CARDIZEM CD) 240 MG 24 hr capsule Take 240 mg by mouth at bedtime.      . Multiple Vitamin (MULITIVITAMIN WITH MINERALS) TABS Take 1 tablet by mouth daily.      . Nebivolol HCl  (BYSTOLIC) 20 MG TABS Take 1 tablet by mouth at bedtime.      . promethazine (PHENERGAN) 12.5 MG tablet Take 12.5 mg by mouth every 12 (twelve) hours as needed. For nausea      . traMADol (ULTRAM) 50 MG tablet Take 50 mg by mouth every 6 (six) hours as needed. For pain      . warfarin (COUMADIN) 2.5 MG tablet Take 2.5 mg by mouth every Monday, Wednesday, and Friday.      . warfarin (COUMADIN) 4 MG tablet Take 4 mg by mouth See admin instructions. Take 4 mg on Sunday, Tuesday, Thursday, and Saturday        Allergies Aspirin; Avandia; Clonidine derivatives; Hctz; Lactose intolerance (gi); Lisinopril; and Tekturna  Family History: Family History  Problem Relation Age of Onset  . Diabetes Mother   . Diabetes Sister   . Diabetes Maternal Aunt     Social History: History   Social History  . Marital Status: Widowed    Spouse Name: N/A    Number of Children: N/A  . Years of Education: N/A   Occupational History  . Not on file.   Social History Main Topics  . Smoking status: Never Smoker   . Smokeless tobacco: Not on file  . Alcohol Use: No  . Drug Use: No  . Sexually Active: No   Other Topics Concern  . Not on file   Social History Narrative   Some high school education. Lived in [place], then  moved in with granddaughter [place] in [facility] Jan 2013.     Electrocardiogram: 3/13/  afib LVH PVC;s  Assessment and Plan

## 2011-05-08 NOTE — Assessment & Plan Note (Signed)
Resolving.  No fever finishing antiobiotic course

## 2011-05-13 ENCOUNTER — Encounter (HOSPITAL_BASED_OUTPATIENT_CLINIC_OR_DEPARTMENT_OTHER): Payer: Medicare Other | Attending: General Surgery

## 2011-05-13 DIAGNOSIS — X58XXXA Exposure to other specified factors, initial encounter: Secondary | ICD-10-CM | POA: Insufficient documentation

## 2011-05-13 DIAGNOSIS — S8010XA Contusion of unspecified lower leg, initial encounter: Secondary | ICD-10-CM | POA: Insufficient documentation

## 2011-05-13 DIAGNOSIS — Z992 Dependence on renal dialysis: Secondary | ICD-10-CM | POA: Insufficient documentation

## 2011-05-13 DIAGNOSIS — E119 Type 2 diabetes mellitus without complications: Secondary | ICD-10-CM | POA: Insufficient documentation

## 2011-05-15 ENCOUNTER — Ambulatory Visit (INDEPENDENT_AMBULATORY_CARE_PROVIDER_SITE_OTHER): Payer: Medicare Other

## 2011-05-15 DIAGNOSIS — Z7901 Long term (current) use of anticoagulants: Secondary | ICD-10-CM

## 2011-05-15 DIAGNOSIS — I4891 Unspecified atrial fibrillation: Secondary | ICD-10-CM

## 2011-05-15 LAB — POCT INR: INR: 2.6

## 2011-05-20 ENCOUNTER — Encounter (HOSPITAL_BASED_OUTPATIENT_CLINIC_OR_DEPARTMENT_OTHER): Payer: Medicare Other

## 2011-05-20 NOTE — Progress Notes (Signed)
[facility]  NAME:  Miranda Cook, Miranda Cook                ACCOUNT NO.:  [id number]  MEDICAL RECORD NO.:  [id number]      DATE OF BIRTH:  1922/02/15  PHYSICIAN:  Joanne Gavel, M.D.              VISIT DATE:                                  OFFICE VISIT   ADMITTING DIAGNOSIS:  Posttraumatic wound, right foreleg.  DISCHARGE DIAGNOSIS:  Posttraumatic wound, right foreleg.  TREATMENT COURSE:  This patient developed a traumatic wound of the right foreleg sometime in early January.  She had a sizable hematoma, which had evacuated.  She is a diabetic on chronic hemodialysis.  This wound was treated with a VAC when we first saw are on 04/22/2011.  The wound was clean, relatively superficial.  She continued with the [facility] for several weeks.  This was removed on 03/26.  She has been treated with silver collagen.  At the time of discharge, the wound is completely healed.  DISCHARGE RECOMMENDATIONS:  See [place] p.r.n.     Joanne Gavel, M.D.     RA/MEDQ  D:  05/20/2011  T:  05/20/2011  Job:  [id number]

## 2011-05-22 ENCOUNTER — Ambulatory Visit (INDEPENDENT_AMBULATORY_CARE_PROVIDER_SITE_OTHER): Payer: Medicare Other

## 2011-05-22 DIAGNOSIS — Z7901 Long term (current) use of anticoagulants: Secondary | ICD-10-CM

## 2011-05-22 DIAGNOSIS — I4891 Unspecified atrial fibrillation: Secondary | ICD-10-CM

## 2011-05-22 LAB — POCT INR: INR: 1.9

## 2011-05-29 ENCOUNTER — Ambulatory Visit (INDEPENDENT_AMBULATORY_CARE_PROVIDER_SITE_OTHER): Payer: Medicare Other | Admitting: *Deleted

## 2011-05-29 DIAGNOSIS — I4891 Unspecified atrial fibrillation: Secondary | ICD-10-CM

## 2011-05-29 DIAGNOSIS — Z7901 Long term (current) use of anticoagulants: Secondary | ICD-10-CM

## 2011-05-29 LAB — POCT INR: INR: 1.9

## 2011-05-29 MED ORDER — WARFARIN SODIUM 4 MG PO TABS: 4.0000 mg | ORAL_TABLET | ORAL | Status: DC

## 2011-06-03 ENCOUNTER — Telehealth (INDEPENDENT_AMBULATORY_CARE_PROVIDER_SITE_OTHER): Payer: Self-pay

## 2011-06-03 ENCOUNTER — Ambulatory Visit (INDEPENDENT_AMBULATORY_CARE_PROVIDER_SITE_OTHER): Payer: Medicare Other | Admitting: General Surgery

## 2011-06-03 ENCOUNTER — Encounter (INDEPENDENT_AMBULATORY_CARE_PROVIDER_SITE_OTHER): Payer: Self-pay | Admitting: General Surgery

## 2011-06-03 VITALS — BP 118/64 | HR 72 | Temp 97.8°F | Resp 18 | Ht 63.0 in | Wt 137.0 lb

## 2011-06-03 DIAGNOSIS — R229 Localized swelling, mass and lump, unspecified: Secondary | ICD-10-CM

## 2011-06-03 DIAGNOSIS — R2230 Localized swelling, mass and lump, unspecified upper limb: Secondary | ICD-10-CM

## 2011-06-03 NOTE — Progress Notes (Signed)
Patient ID: Miranda Cook, female   DOB: [birth year], 78 y.o.   MRN: [id number]  Chief Complaint  Patient presents with  . New Evaluation    Nodule on forearm    HPI Miranda Cook is a 78 y.o. female.   HPI  This patient is referred by Dr. Eliott Cook for evaluation of her right arm nodule which presented approximately 2 weeks ago. She never had any nodule palpable in the area and then she had noticed a smaller nodule in the right forearm. It increased in size and became tender and subsequently decreased in size to its current size. She denies any tenderness currently and she says that it is not bothering her. She said that when it was tender she had some associated redness but otherwise has not noticed any recent redness, drainage, or pain. She denies any other trauma to the area or other masses.  Past Medical History  Diagnosis Date  . Renal disorder   . Atrial fibrillation   . Hypertension   . Diabetes mellitus   . Gout   . Cancer     colon  . ESRD (end stage renal disease) on dialysis   . Confusion     Past Surgical History  Procedure Date  . Cardiac catheterization   . Hematoma evacuation 03/2011    right tibial, wound vac treatment  . Colon surgery     polyp removal  . Hand surgery 1980's    Family History  Problem Relation Age of Onset  . Diabetes Mother   . Diabetes Sister   . Diabetes Maternal Aunt     Social History History  Substance Use Topics  . Smoking status: Never Smoker   . Smokeless tobacco: Never Used  . Alcohol Use: No    Allergies  Allergen Reactions  . Aspirin   . Avandia (Rosiglitazone Maleate)   . Clonidine Derivatives Other (See Comments)    unknown  . Hctz (Hydrochlorothiazide) Other (See Comments)    unknown  . Lactose Intolerance (Gi)   . Lisinopril Other (See Comments)    unknown  . Tekturna (Aliskiren Fumarate) Other (See Comments)    unknown    Current Outpatient Prescriptions  Medication Sig Dispense Refill  . warfarin (COUMADIN)  2 MG tablet Take 2 mg by mouth daily.      Marland Kitchen diltiazem (CARDIZEM CD) 240 MG 24 hr capsule Take 240 mg by mouth at bedtime.      . Multiple Vitamin (MULITIVITAMIN WITH MINERALS) TABS Take 1 tablet by mouth daily.      . Nebivolol HCl (BYSTOLIC) 20 MG TABS Take 1 tablet by mouth at bedtime.      . promethazine (PHENERGAN) 12.5 MG tablet Take 12.5 mg by mouth every 12 (twelve) hours as needed. For nausea      . traMADol (ULTRAM) 50 MG tablet Take 50 mg by mouth every 6 (six) hours as needed. For pain      . warfarin (COUMADIN) 4 MG tablet Take 1 tablet (4 mg total) by mouth See admin instructions. Take as directed by coumadin clinic  35 tablet  3  . DISCONTD: warfarin (COUMADIN) 2.5 MG tablet Take 2.5 mg by mouth every Monday, Wednesday, and Friday.        Review of Systems Review of Systems All other review of systems negative or noncontributory except as stated in the HPI  Blood pressure 118/64, pulse 72, temperature 97.8 F (36.6 C), temperature source Temporal, resp. rate 18, height 5\' 3"  (1.6 m), weight  137 lb (62.143 kg).  Physical Exam Physical Exam Physical Exam  Nursing note and vitals reviewed. Constitutional: She is oriented to person, place, and time. She appears well-developed and well-nourished. No distress.  HENT:  Head: Normocephalic and atraumatic.  Mouth/Throat: No oropharyngeal exudate.  Eyes: Conjunctivae and EOM are normal. Pupils are equal, round, and reactive to light. Right eye exhibits no discharge. Left eye exhibits no discharge. No scleral icterus.  Neck: Normal range of motion. Neck supple. No tracheal deviation present.  Cardiovascular: Normal rate, regular rhythm, normal heart sounds and intact distal pulses.   Pulmonary/Chest: Effort normal and breath sounds normal. No stridor. No respiratory distress. She has no wheezes.  Abdominal: Soft. Bowel sounds are normal. She exhibits no distension and no mass. There is no tenderness. There is no rebound and no  guarding.  Musculoskeletal: Normal range of motion. She exhibits no edema and no tenderness.  Neurological: She is alert and oriented to person, place, and time.  Skin: Skin is warm and dry. No rash noted. She is not diaphoretic. No erythema. No pallor.  She has a palpable 3 cm nodule in her right forearm midway between the wrist and her elbow on the ventral side. This appears to be independent of the skin it is nontender and there is no evidence of infection. I performed a bedside ultrasound with a high-frequency linear probe which demonstrated a purely cystic appearing nodule measuring approximately 22mm long by 1 cm deep. There was no color flow Doppler within the cyst and appeared to be independent of any blood vessels. Psychiatric: She has a normal mood and affect. Her behavior is normal. Judgment and thought content normal.    Data Reviewed   Assessment    Right forearm mass This appears to be a cystic lesion of her forearm but appears to be independent of the skin and not consistent with an epidermal inclusion cyst or lipoma. I am not sure what this is but may be related to the nerve sheath or tendon sheath. I discussed with her the options for imaging and possible excision but she is not interested in surgery at this time. Since she is not interested in surgery anyway, I  do not think we need any further imaging. If she changes her mind and has decided she would like to have this removed, then I would recommend MRI prior to any excision. As this may be tendon or nerve related and would likely need a hand surgeon. I doubt that this is any malignancy given her history of sudden onset and regression in size.    Plan    Followup p.r.n. if she would like to entertain excision       Miranda Cook DAVID 06/03/2011, 5:05 PM

## 2011-06-03 NOTE — Telephone Encounter (Signed)
Called and spoke with Granddaughter to offer earlier appointment for today, patient cannot drive and not able to get her here any earlier due to her work schedule.

## 2011-06-09 ENCOUNTER — Other Ambulatory Visit: Payer: Self-pay | Admitting: Family Medicine

## 2011-06-12 ENCOUNTER — Ambulatory Visit (INDEPENDENT_AMBULATORY_CARE_PROVIDER_SITE_OTHER): Payer: Medicare Other | Admitting: *Deleted

## 2011-06-12 DIAGNOSIS — I4891 Unspecified atrial fibrillation: Secondary | ICD-10-CM

## 2011-06-12 DIAGNOSIS — Z7901 Long term (current) use of anticoagulants: Secondary | ICD-10-CM

## 2011-06-20 ENCOUNTER — Encounter: Payer: Self-pay | Admitting: Cardiovascular Disease

## 2011-06-26 ENCOUNTER — Ambulatory Visit (INDEPENDENT_AMBULATORY_CARE_PROVIDER_SITE_OTHER): Payer: Medicare Other | Admitting: *Deleted

## 2011-06-26 DIAGNOSIS — I4891 Unspecified atrial fibrillation: Secondary | ICD-10-CM

## 2011-06-26 DIAGNOSIS — Z7901 Long term (current) use of anticoagulants: Secondary | ICD-10-CM

## 2011-07-04 ENCOUNTER — Emergency Department (HOSPITAL_COMMUNITY)
Admission: EM | Admit: 2011-07-04 | Discharge: 2011-07-05 | Disposition: A | Discharge status: Home / Self Care | Payer: Medicare Other | Attending: Emergency Medicine | Admitting: Emergency Medicine

## 2011-07-04 ENCOUNTER — Emergency Department (HOSPITAL_COMMUNITY): Payer: Medicare Other

## 2011-07-04 ENCOUNTER — Encounter (HOSPITAL_COMMUNITY): Payer: Self-pay | Admitting: Adult Health

## 2011-07-04 DIAGNOSIS — R059 Cough, unspecified: Secondary | ICD-10-CM | POA: Insufficient documentation

## 2011-07-04 DIAGNOSIS — I120 Hypertensive chronic kidney disease with stage 5 chronic kidney disease or end stage renal disease: Secondary | ICD-10-CM | POA: Insufficient documentation

## 2011-07-04 DIAGNOSIS — N390 Urinary tract infection, site not specified: Secondary | ICD-10-CM | POA: Insufficient documentation

## 2011-07-04 DIAGNOSIS — Z85038 Personal history of other malignant neoplasm of large intestine: Secondary | ICD-10-CM | POA: Insufficient documentation

## 2011-07-04 DIAGNOSIS — I4891 Unspecified atrial fibrillation: Secondary | ICD-10-CM | POA: Insufficient documentation

## 2011-07-04 DIAGNOSIS — R509 Fever, unspecified: Secondary | ICD-10-CM | POA: Insufficient documentation

## 2011-07-04 DIAGNOSIS — Z862 Personal history of diseases of the blood and blood-forming organs and certain disorders involving the immune mechanism: Secondary | ICD-10-CM | POA: Insufficient documentation

## 2011-07-04 DIAGNOSIS — E119 Type 2 diabetes mellitus without complications: Secondary | ICD-10-CM | POA: Insufficient documentation

## 2011-07-04 DIAGNOSIS — Z8639 Personal history of other endocrine, nutritional and metabolic disease: Secondary | ICD-10-CM | POA: Insufficient documentation

## 2011-07-04 DIAGNOSIS — Z79899 Other long term (current) drug therapy: Secondary | ICD-10-CM | POA: Insufficient documentation

## 2011-07-04 DIAGNOSIS — N186 End stage renal disease: Secondary | ICD-10-CM | POA: Insufficient documentation

## 2011-07-04 DIAGNOSIS — R05 Cough: Secondary | ICD-10-CM | POA: Insufficient documentation

## 2011-07-04 DIAGNOSIS — Z7901 Long term (current) use of anticoagulants: Secondary | ICD-10-CM | POA: Insufficient documentation

## 2011-07-04 LAB — URINE MICROSCOPIC-ADD ON

## 2011-07-04 LAB — URINALYSIS, ROUTINE W REFLEX MICROSCOPIC
Glucose, UA: NEGATIVE mg/dL
Ketones, ur: NEGATIVE mg/dL
Protein, ur: >300 mg/dL — AB

## 2011-07-04 LAB — BASIC METABOLIC PANEL
BUN: 20 mg/dL (ref 6–23)
CO2: 30 mEq/L (ref 19–32)
Calcium: 9.4 mg/dL (ref 8.4–10.5)
Glucose, Bld: 125 mg/dL — ABNORMAL HIGH (ref 70–99)
Sodium: 136 mEq/L (ref 135–145)

## 2011-07-04 MED ORDER — DEXTROSE 5 % IV SOLN
1.0000 g | Freq: Once | INTRAVENOUS | Status: DC
  Filled 2011-07-04: qty 10

## 2011-07-04 MED ORDER — CEPHALEXIN 500 MG PO CAPS: 500.0000 mg | ORAL_CAPSULE | Freq: Four times a day (QID) | ORAL | Status: AC

## 2011-07-04 MED ORDER — ACETAMINOPHEN 325 MG PO TABS
650.0000 mg | ORAL_TABLET | Freq: Once | ORAL | Status: AC
  Administered 2011-07-04: 650 mg via ORAL
  Filled 2011-07-04: qty 2

## 2011-07-04 NOTE — ED Notes (Signed)
Sent over by dialysis for a fever of 100.7/ pt received acetaminophen and finished dialysis. Pt denies any complaints and does not want to be here. Family states she had a cough over the last week. Bilateral breath sounds clear.

## 2011-07-04 NOTE — ED Provider Notes (Signed)
I saw and evaluated the patient, reviewed the resident's note and I agree with the findings and plan.  Pt seen and evaluated, pt with equivocal cath urine specimen- culture sent- pt started on antibiotics-pt appears to be at her baseline, CXR reviewed and reassuring  Ethelda Chick, MD 07/04/11 2358

## 2011-07-04 NOTE — ED Provider Notes (Signed)
History     CSN: [id number]  Arrival date & time 07/04/11  1757   First MD Initiated Contact with Patient 07/04/11 1823      Chief Complaint  Patient presents with  . Fever    (Consider location/radiation/quality/duration/timing/severity/associated sxs/prior treatment) HPI Comments: Fever to 100.5 today.  Says she has had a cold but no other symptoms.  Finished dialysis today.  Says she feels well and would like to go home.  No other complaints.  Patient is a 78 y.o. female presenting with fever. The history is provided by the patient.  Fever Primary symptoms of the febrile illness include fever and cough. Primary symptoms do not include shortness of breath, abdominal pain, vomiting, diarrhea, dysuria or rash. The current episode started today. This is a new problem. The problem has not changed since onset.   Past Medical History  Diagnosis Date  . Renal disorder   . Atrial fibrillation   . Hypertension   . Diabetes mellitus   . Gout   . Cancer     colon  . ESRD (end stage renal disease) on dialysis   . Confusion     Past Surgical History  Procedure Date  . Cardiac catheterization   . Hematoma evacuation 03/2011    right tibial, wound vac treatment  . Colon surgery     polyp removal  . Hand surgery 1980's    Family History  Problem Relation Age of Onset  . Diabetes Mother   . Diabetes Sister   . Diabetes Maternal Aunt     History  Substance Use Topics  . Smoking status: Never Smoker   . Smokeless tobacco: Never Used  . Alcohol Use: No    OB History    Grav Para Term Preterm Abortions TAB SAB Ect Mult Living                  Review of Systems  Constitutional: Positive for fever. Negative for activity change.  HENT: Negative for congestion.   Eyes: Negative for visual disturbance.  Respiratory: Positive for cough. Negative for chest tightness and shortness of breath.   Cardiovascular: Negative for chest pain and leg swelling.  Gastrointestinal:  Negative for vomiting, abdominal pain and diarrhea.  Genitourinary: Negative for dysuria.  Skin: Negative for rash.  Neurological: Negative for syncope.  Psychiatric/Behavioral: Negative for behavioral problems.    Allergies  Aspirin; Avandia; Clonidine derivatives; Hctz; Lactose intolerance (gi); Lisinopril; and Tekturna  Home Medications   Current Outpatient Rx  Name Route Sig Dispense Refill  . DILTIAZEM HCL ER COATED BEADS 240 MG PO CP24 Oral Take 240 mg by mouth at bedtime.    . ADULT MULTIVITAMIN W/MINERALS CH Oral Take 1 tablet by mouth daily.    . NEBIVOLOL HCL 20 MG PO TABS Oral Take 1 tablet by mouth at bedtime.    Marland Kitchen PROMETHAZINE HCL 12.5 MG PO TABS Oral Take 12.5 mg by mouth every 12 (twelve) hours as needed. For nausea    . TRAMADOL HCL 50 MG PO TABS Oral Take 50 mg by mouth every 6 (six) hours as needed. For pain    . WARFARIN SODIUM 4 MG PO TABS Oral Take 4 mg by mouth daily.    . CEPHALEXIN 500 MG PO CAPS Oral Take 1 capsule (500 mg total) by mouth 4 (four) times daily. 28 capsule 0    BP 136/72  Pulse 98  Temp(Src) 100.5 F (38.1 C) (Oral)  Resp 18  SpO2 92%  Physical Exam  Constitutional: She is oriented to person, place, and time. She appears well-developed and well-nourished.  HENT:  Head: Normocephalic and atraumatic.  Eyes: Conjunctivae and EOM are normal. Pupils are equal, round, and reactive to light. No scleral icterus.  Neck: Normal range of motion. Neck supple.  Cardiovascular: Normal rate and regular rhythm.  Exam reveals no gallop and no friction rub.   No murmur heard. Pulmonary/Chest: Effort normal and breath sounds normal. No respiratory distress. She has no wheezes. She has no rales. She exhibits no tenderness.  Abdominal: Soft. She exhibits no distension and no mass. There is no tenderness. There is no rebound and no guarding.  Musculoskeletal: Normal range of motion.  Neurological: She is alert and oriented to person, place, and time. She has  normal reflexes. No cranial nerve deficit.  Skin: Skin is warm and dry. No rash noted.  Psychiatric: She has a normal mood and affect. Her behavior is normal. Judgment and thought content normal.    ED Course  Procedures (including critical care time)  Labs Reviewed  URINALYSIS, ROUTINE W REFLEX MICROSCOPIC - Abnormal; Notable for the following:    APPearance CLOUDY (*)    Hgb urine dipstick LARGE (*)    Bilirubin Urine SMALL (*)    Protein, ur >300 (*)    Leukocytes, UA SMALL (*)    All other components within normal limits  BASIC METABOLIC PANEL - Abnormal; Notable for the following:    Potassium 3.0 (*)    Chloride 95 (*)    Glucose, Bld 125 (*)    Creatinine, Ser 2.25 (*)    GFR calc non Af Amer 18 (*)    GFR calc Af Amer 21 (*)    All other components within normal limits  URINE MICROSCOPIC-ADD ON - Abnormal; Notable for the following:    Bacteria, UA FEW (*)    Casts HYALINE CASTS (*)    All other components within normal limits  URINE CULTURE   Dg Chest 2 View  07/04/2011  *RADIOLOGY REPORT*  Clinical Data: Fever.  CHEST - 2 VIEW  Comparison: 04/24/2011  Findings: Stable cardiomegaly and tortuosity of the thoracic aorta noted.  Rightward deviation of the trachea is present; right paratracheal density could be due to ectatic vasculature or possibly right upper lobe atelectasis.  Low lung volumes are present.  Indistinct pulmonary vasculatures suggest pulmonary venous hypertension. Trace bilateral pleural effusions are reduced in size compared the prior exam.  Linear subsegmental atelectasis or scarring noted at the right lung base peripherally.  There is improved blunting of the left costophrenic angle.  IMPRESSION:  1.  Cardiomegaly and pulmonary venous hypertension with trace bilateral pleural effusions improved compared the prior exam 2.  Possible atelectasis medially in the right upper lobe.  The rightward tracheal deviation could alternatively be from a left side the thyroid  goiter. This could be worked up with dedicated chest CT if clinically warranted. 3.  Tortuous aorta. 4.  Subsegmental atelectasis or scarring peripherally in the right lower lobe.  Original Report Authenticated By: Dellia Cloud, M.D.     1. UTI (lower urinary tract infection)       MDM  Fever to 100.5 today.  Says she has had a cold but no other symptoms.  Finished dialysis today.  Says she feels well and would like to go home.  No other complaints.  VSS and well appearing.  Low grade fever in ED.  CXR without PNA.  UA equivocal for UTI.  Will treat  with rocephin and d/c on keflex.  Pt observed in ED for 6 hours and remains well appearing and without evidence of sepsis.  PCP f/u.   Will return if new symptoms develop.  Pt comfortable with plan and will follow up.         Army Chaco, MD 07/04/11 [phone]

## 2011-07-04 NOTE — ED Notes (Signed)
Given Vanc 1500 mg, Ceftazadine 2000 mg IV at dialysis.

## 2011-07-04 NOTE — ED Notes (Signed)
Pt given happy meal per RNs approval.  Pt resting comfortably at this time

## 2011-07-04 NOTE — Discharge Instructions (Signed)

## 2011-07-04 NOTE — ED Notes (Signed)
Pt cannot ambulate; physician and nurse notified

## 2011-07-04 NOTE — ED Notes (Signed)
EDP made aware that pt does not ambulate at home. Will continue to monitor.

## 2011-07-04 NOTE — ED Notes (Signed)
Urine Culture added on to urine previously sent to lab.

## 2011-07-05 MED ORDER — CEFTRIAXONE SODIUM 1 G IJ SOLR
1.0000 g | Freq: Once | INTRAMUSCULAR | Status: AC
  Administered 2011-07-05: 1 g via INTRAMUSCULAR
  Filled 2011-07-05: qty 10

## 2011-07-05 MED ORDER — LIDOCAINE HCL (PF) 1 % IJ SOLN
INTRAMUSCULAR | Status: AC
  Administered 2011-07-05: 01:00:00 via INTRAMUSCULAR
  Filled 2011-07-05: qty 5

## 2011-07-05 NOTE — ED Notes (Signed)
Pt and family given rx x 1,discharge and follow up instructions after speaking with MD. Pt and family deny further needs at this time. Pt to lobby in wheel chair in NAD at this time

## 2011-07-06 LAB — URINE CULTURE: Culture: NO GROWTH

## 2011-07-17 ENCOUNTER — Ambulatory Visit (INDEPENDENT_AMBULATORY_CARE_PROVIDER_SITE_OTHER): Payer: Medicare Other | Admitting: *Deleted

## 2011-07-17 DIAGNOSIS — I4891 Unspecified atrial fibrillation: Secondary | ICD-10-CM

## 2011-07-17 DIAGNOSIS — Z7901 Long term (current) use of anticoagulants: Secondary | ICD-10-CM

## 2011-08-03 ENCOUNTER — Other Ambulatory Visit: Payer: Self-pay | Admitting: Family Medicine

## 2011-08-06 ENCOUNTER — Other Ambulatory Visit: Payer: Self-pay | Admitting: Family Medicine

## 2011-08-07 ENCOUNTER — Ambulatory Visit (INDEPENDENT_AMBULATORY_CARE_PROVIDER_SITE_OTHER): Payer: Medicare Other

## 2011-08-07 DIAGNOSIS — Z7901 Long term (current) use of anticoagulants: Secondary | ICD-10-CM

## 2011-08-07 DIAGNOSIS — I4891 Unspecified atrial fibrillation: Secondary | ICD-10-CM

## 2011-08-07 LAB — POCT INR: INR: 2.4

## 2011-08-19 ENCOUNTER — Ambulatory Visit: Payer: Medicare Other | Admitting: Family Medicine

## 2011-09-02 ENCOUNTER — Ambulatory Visit (INDEPENDENT_AMBULATORY_CARE_PROVIDER_SITE_OTHER): Payer: Medicare Other | Admitting: *Deleted

## 2011-09-02 DIAGNOSIS — I4891 Unspecified atrial fibrillation: Secondary | ICD-10-CM

## 2011-09-02 DIAGNOSIS — Z7901 Long term (current) use of anticoagulants: Secondary | ICD-10-CM

## 2011-09-02 LAB — POCT INR: INR: 2.4

## 2011-09-05 ENCOUNTER — Encounter: Payer: Self-pay | Admitting: Family Medicine

## 2011-09-16 ENCOUNTER — Ambulatory Visit (INDEPENDENT_AMBULATORY_CARE_PROVIDER_SITE_OTHER): Payer: Medicare Other | Admitting: Family Medicine

## 2011-09-16 DIAGNOSIS — N186 End stage renal disease: Secondary | ICD-10-CM

## 2011-09-16 DIAGNOSIS — E119 Type 2 diabetes mellitus without complications: Secondary | ICD-10-CM

## 2011-09-16 DIAGNOSIS — F0390 Unspecified dementia without behavioral disturbance: Secondary | ICD-10-CM

## 2011-09-16 LAB — POCT GLYCOSYLATED HEMOGLOBIN (HGB A1C): Hemoglobin A1C: 6.4

## 2011-09-16 NOTE — Patient Instructions (Addendum)
Nice to see you again. I want to make your living situation as safe as possible. I'm glad you are trying to keep your independence and activity level up. I will be in contact with our social worker about this paperwork.

## 2011-09-17 ENCOUNTER — Telehealth: Payer: Self-pay | Admitting: *Deleted

## 2011-09-17 NOTE — Assessment & Plan Note (Signed)
Estimated Mini-Mental Status 20/30. The patient desires to find a safe independent/assisted living facility. They desire for a completed FL2. Due to patient's dementia and weakness after dialysis episodes, I do not feel independent living is appropriate. No identified skilled nursing needs. Since patient's coverage is Medicare at this point, will need to personally finance an assisted living. Discussed case with social work. I believe it is the patient's best interest to stay at her daughter's house currently. Plan to bring patient in to geriatric clinic for a full assessment.

## 2011-09-17 NOTE — Telephone Encounter (Signed)
Called and LMOVM informing of the FL2 paperwork being completed and ready for p/u, also pt will need to make an appt to be seen in the Geriatric clinic within the next 2-4 weeks.Miranda Cook [place]

## 2011-09-17 NOTE — Progress Notes (Signed)
  Subjective:    Patient ID: Miranda Cook, female    DOB: [birth year], 78 y.o.   MRN: [id number]  HPI  1. psychosocial issues. Patient presents today with her 2 daughters to discuss living situation. Belinda lives in [place], the other lives in [place]. Patient was previously at a rehabilitation facility after she fractured her leg, followed by hospitalization for wound infection after which time she was discharged to her daughter Massie Bougie) house. She has been living there with her daughter for the past several months. Patient has become unhappy with this. Feels she is not treated fairly. Wishes to live independently. Patient does not describe any abusive type problems, but feels that her family annoys her. Her daughter's desire the safest living situation, and remained quiet today and order for the patient to get her thoughts out. Patient is very irritable and sensitive to any comments they make. Patient is independent in her ADLs including bathing, food preparation, mobility, dressing, toileting. Her dementia has caused issues such as leaving food on the stove unattended. She has periods of weakness after her hemodialysis sessions, to which her son-in-law transports to and from.  Patient has attempted contacting assisted living facilities, she states she cannot afford so far.  Denies any falls, trauma, weakness, confusion episodes.  Review of Systems See HPI otherwise negative.  reports that she has never smoked. She has never used smokeless tobacco.     Objective:   Physical Exam  Vitals reviewed. Constitutional: She appears well-developed and well-nourished. No distress.  HENT:  Head: Normocephalic and atraumatic.  Mouth/Throat: Oropharynx is clear and moist.  Eyes: EOM are normal. Pupils are equal, round, and reactive to light.  Cardiovascular: Normal rate, regular rhythm and normal heart sounds.   No murmur heard. Pulmonary/Chest: Effort normal and breath sounds normal. No respiratory  distress. She has no wheezes.  Musculoskeletal: She exhibits no edema and no tenderness.  Neurological: She is alert. No cranial nerve deficit. She exhibits normal muscle tone. Coordination normal.       Ambulatory. Normal finger-nose testing. Balance adequate. 3/5 orientation to time. 0/5 spelling "world" backwards.  0/3 recall.   Skin: No rash noted. She is not diaphoretic.  Psychiatric: Her behavior is normal. Thought content normal.       Irritable.           Assessment & Plan:

## 2011-09-17 NOTE — Telephone Encounter (Signed)
Message copied by Deno Etienne on Wed Sep 17, 2011 12:53 PM ------      Message from: Durwin Reges      Created: Wed Sep 17, 2011  8:02 AM      Regarding: paper ready to pick up       Please call patient/daughter with following message. Schedule appointment with geriatric clinic in 2-4 weeks. They can pick up FL2. Patient would require assisted living. Nelva Bush says that with medicare only, patient has to self pay and no known assistance right now.

## 2011-09-18 ENCOUNTER — Telehealth: Payer: Self-pay | Admitting: *Deleted

## 2011-09-18 NOTE — Telephone Encounter (Signed)
error 

## 2011-09-18 NOTE — Telephone Encounter (Signed)
Called and LMOVM for pt's granddaughter to call back to make appt for Geriatric clinic in the next 2-4 weeks. I will close this phone note.Loralee Pacas [place]

## 2011-09-22 ENCOUNTER — Telehealth: Payer: Self-pay | Admitting: Clinical

## 2011-09-22 NOTE — Telephone Encounter (Signed)
Clinical Child psychotherapist (CSW) contacted pt grand daughter [place] P[place] to inform her of the SNF/ALF process. CSW informed Massie Bougie that Medicare will only pay SNF placement if a pt has had a 3day qualifying stay in the hospital within the past 30 days. CSW also informed Belinda that Medicaid is the only insurance that ALF's will accept (some facilities only have a few Medicaid beds available IF they accept Medicaid.) CSW encouraged Belinda to assist pt in applying for Medicaid. Massie Bougie stated that pt will "not apply for Medicaid because of pride." CSW informed Massie Bougie that unfortunately her only other option would be to pay privately. Belinda asked for CSW to contact Gordy Councilman, [phone]) [phone] who is pt family member currently working to find placement for pt. CSW left a message for Ms. Green.  Theresia Bough, MSW, Theresia Majors [phone]

## 2011-09-22 NOTE — Telephone Encounter (Signed)
Message copied by Theresia Bough on Mon Sep 22, 2011  2:29 PM ------      Message from: Durwin Reges      Created: Thu Sep 18, 2011 10:58 AM      Regarding: RE: wants FL2 for ?assisted living       Yes that would be great. Thanks [facility] [employer]. Sorry to be bugging you so much recently. Guess we inherit more patients with complex issues as third year residents.            ----- Message -----         From: Theresia Bough, LCSW         Sent: 09/18/2011  10:55 AM           To: Durwin Reges, MD      Subject: RE: wants FL2 for ?assisted living                       Edwyna Shell,             Would you like me to contact the pt/dtr to provide them with education regarding placement/cost etc.?            Feel free to call me [phone]      ----- Message -----         From: Durwin Reges, MD         Sent: 09/17/2011   9:41 AM           To: Theresia Bough, LCSW      Subject: RE: wants FL2 for ?assisted living                       I need to do more formal test of her dementia, probably moderate now. She is not safe to live alone, so pace might be good if she could still live with her daughter then go.       ----- Message -----         From: Theresia Bough, LCSW         Sent: 09/17/2011   8:43 AM           To: Durwin Reges, MD      Subject: RE: wants FL2 for ?assisted living                       She could potentially qualify for [facility]. I'm not sure if she is looking to find a facility but PACE is not an inpatient facility. She would get rehab/services there mostly every day but return home everyday. The process can take 45days+ to get accepted. I can give her a call today. I'm more concerned with the fact that she has dementia, is it pretty advanced? Are her requests reasonable? If her dtr that she has issues with is her HCPOA I'm not sure the pt can make her own decisions then...      ----- Message -----         From: Durwin Reges, MD         Sent: 09/16/2011   8:33 PM           To: Theresia Bough, LCSW  Subject: RE: wants FL2 for ?assisted living                       What about the PACE program? Is that medicaid only, too?      -----  Message -----         From: Theresia Bough, LCSW         Sent: 09/16/2011   2:12 PM           To: Durwin Reges, MD      Subject: RE: wants FL2 for ?assisted living                       Hi Noreene Larsson,             Her insurance does not cover an assisted living facility (ALF). The only insurance that will cover ALF placement is Medicaid and only certain facilities accept Medicaid. Medicare only covers SNF placement if a pt has a skilled need and has had a 3 day inpatient qualifying stay in the hospital (within 30 days.) Unfortunately placement does not seem feasible if she is unable to pay privately.            Nelva Bush      ----- Message -----         From: Durwin Reges, MD         Sent: 09/16/2011  12:31 PM           To: Theresia Bough, LCSW      Subject: wants FL2 for ?assisted living                           Pricilla Riffle,      I have this patient 20 on dialysis, with dementia. She is argumentative with her daughter that she lives with and wants to move out. Probably assisted living is most appropriate. I started an FL2 today, can you look at it with me and try to figure out a good dispo according to her insurance/income. She has medicare currently. States she has called several places that are too expensive.

## 2011-09-25 ENCOUNTER — Ambulatory Visit (INDEPENDENT_AMBULATORY_CARE_PROVIDER_SITE_OTHER): Payer: Medicare Other | Admitting: Family Medicine

## 2011-09-25 VITALS — BP 147/82 | HR 100 | Ht 63.0 in | Wt 139.0 lb

## 2011-09-25 DIAGNOSIS — Z6379 Other stressful life events affecting family and household: Secondary | ICD-10-CM

## 2011-09-25 DIAGNOSIS — F0390 Unspecified dementia without behavioral disturbance: Secondary | ICD-10-CM

## 2011-09-25 DIAGNOSIS — Z636 Dependent relative needing care at home: Secondary | ICD-10-CM

## 2011-09-25 NOTE — Progress Notes (Signed)
  Subjective:    Patient ID: Miranda Cook, female    DOB: 09-Dec-1922, 78 y.o.   MRN: [id number]  HPI Geriatric clinic  Patient is here with her granddaughter with whom she lives since end of February 2013. Patient reiterated multiple times how she would like to live by herself.   She complains of feeling sad sometimes she cannot live by herself. Sometimes she has difficulty sleeping. She does not want to take an anti-depressant.  She does not have a HCPOA. She does not want anyone else to make her medical decisions.   Review of Systems Denies chest pain, palpitations Denies dyspnea Denies constipation/nausea    Objective:   Physical Exam GEN: NAD; well-nourished, -appearing; thin; accompanied by granddaughter PSYCH:   MMSE 19/30. See documentation flowsheet. CV: irregularly irregular PULM: CTAB without m/r/g; NI WOB ABD: soft, NT EXT: no edema NEURO: no deficits; able to move all extremities without problem   GAIT: uses cane most of the time  IADL Independent Needs Assistance Dependent  Cooking  x   Housework  x   Manage Medications  x   Manage the telephone x    Shopping for food, clothes, Meds, etc  x   Use transportation  x   Manage Finances  x       Assessment & Plan:

## 2011-09-25 NOTE — Assessment & Plan Note (Signed)
Patient lives with granddaughter and granddaughter's children. They appear to be doing a decent job taking care of the patient and making sure she is safe, but the patient says they do not get along.  The future plan is for patient to live with one of her daughter. They are still trying to get assisted living arranged for patient, however, not sure how feasible this is since she makes too much social security income to qualify.

## 2011-09-25 NOTE — Assessment & Plan Note (Addendum)
Patient with dementia.  -She is in denial and refuses dementia medication -She may benefit from anti-depressant. She is depressed she cannot live alone and must live with family and she has difficulty sleeping. However, patient declined medication

## 2011-09-25 NOTE — Patient Instructions (Addendum)
Follow-up with Dr. Cristal Ford in the next 3 months or sooner if needed

## 2011-10-05 ENCOUNTER — Other Ambulatory Visit: Payer: Self-pay | Admitting: Family Medicine

## 2011-10-06 ENCOUNTER — Other Ambulatory Visit: Payer: Self-pay | Admitting: *Deleted

## 2011-10-06 MED ORDER — WARFARIN SODIUM 4 MG PO TABS: 4.0000 mg | ORAL_TABLET | Freq: Every day | ORAL | Status: AC

## 2011-10-07 ENCOUNTER — Ambulatory Visit (INDEPENDENT_AMBULATORY_CARE_PROVIDER_SITE_OTHER): Payer: Medicare Other | Admitting: Pharmacist

## 2011-10-07 DIAGNOSIS — Z7901 Long term (current) use of anticoagulants: Secondary | ICD-10-CM

## 2011-10-07 DIAGNOSIS — I4891 Unspecified atrial fibrillation: Secondary | ICD-10-CM

## 2011-10-07 LAB — POCT INR: INR: 2.3

## 2011-10-08 LAB — PROTIME-INR: INR: 2.3 — AB (ref 0.9–1.1)

## 2011-10-10 ENCOUNTER — Encounter: Payer: Self-pay | Admitting: Internal Medicine

## 2011-10-10 DIAGNOSIS — I4891 Unspecified atrial fibrillation: Secondary | ICD-10-CM

## 2011-10-10 DIAGNOSIS — Z7901 Long term (current) use of anticoagulants: Secondary | ICD-10-CM

## 2011-10-14 ENCOUNTER — Telehealth: Payer: Self-pay | Admitting: Cardiovascular Disease

## 2011-10-14 NOTE — Telephone Encounter (Signed)
LMTCB for pt. I am not sure who initially called pt.

## 2011-10-14 NOTE — Telephone Encounter (Signed)
New Problem:    Called returning someone's call.  Please call back.

## 2011-10-14 NOTE — Telephone Encounter (Signed)
Pt's daughter called to see who had called pt--daughter states it was a coumadin question, which she has already taken care of

## 2011-10-14 NOTE — Progress Notes (Signed)
This encounter was created in error - please disregard.

## 2011-11-04 ENCOUNTER — Other Ambulatory Visit: Payer: Self-pay | Admitting: Family Medicine

## 2011-11-06 ENCOUNTER — Encounter: Payer: Self-pay | Admitting: Cardiovascular Disease

## 2011-11-06 ENCOUNTER — Ambulatory Visit (INDEPENDENT_AMBULATORY_CARE_PROVIDER_SITE_OTHER): Payer: Medicare Other | Admitting: *Deleted

## 2011-11-06 ENCOUNTER — Ambulatory Visit (INDEPENDENT_AMBULATORY_CARE_PROVIDER_SITE_OTHER): Payer: Medicare Other | Admitting: Cardiovascular Disease

## 2011-11-06 VITALS — BP 128/80 | HR 84 | Ht 63.0 in | Wt 145.0 lb

## 2011-11-06 DIAGNOSIS — Z7901 Long term (current) use of anticoagulants: Secondary | ICD-10-CM

## 2011-11-06 DIAGNOSIS — I4891 Unspecified atrial fibrillation: Secondary | ICD-10-CM

## 2011-11-06 DIAGNOSIS — I10 Essential (primary) hypertension: Secondary | ICD-10-CM

## 2011-11-06 DIAGNOSIS — N186 End stage renal disease: Secondary | ICD-10-CM

## 2011-11-06 LAB — POCT INR: INR: 1.8

## 2011-11-06 NOTE — Progress Notes (Signed)
Patient ID: Miranda Cook, female   DOB: 04/22/1922, 78 y.o.   MRN: [id number] 78 yo just moved back to this area from [place]. Recent hospitalization for RLE cellulitis. Still on antibiotics. Been on coumadin and dialysis over 2 years. Chronic afib. No bleeding problems although INR supraRx while on antibiotics in hospital. No SSCP, palpitations, dyspnea. Reviewed echo from hospital and EF normal 65%. Severe atrial enlargement and not a candicate for cardioversion. No history of CAD. Can ambulate and do ADL's at home. Daughter she raised is her caregiver. No fever legs improving. LE venous duplex from hospital negative for DVT. Long history of LE varicosities  No problems with dialysis  Getting coumadin checked today   ROS: Denies fever, malais, weight loss, blurry vision, decreased visual acuity, cough, sputum, SOB, hemoptysis, pleuritic pain, palpitaitons, heartburn, abdominal pain, melena, lower extremity edema, claudication, or rash.  All other systems reviewed and negative  General: Affect appropriate Elderly black female HEENT: normal Neck supple with no adenopathy JVP normal no bruits no thyromegaly Lungs clear with no wheezing and good diaphragmatic motion Heart:  S1/S2 no murmur, no rub, gallop or click PMI normal Abdomen: benighn, BS positve, no tenderness, no AAA no bruit.  No HSM or HJR Distal pulses intact with no bruits No edema Neuro non-focal Skin warm and dry Fistula LUE with good thrill No muscular weakness   Current Outpatient Prescriptions  Medication Sig Dispense Refill  . B Complex-C-Folic Acid (DIALYVITE TABLET) TABS TAKE 1 TABLET BY MOUTH WITH MEALS  90 tablet  0  . calcium acetate (PHOSLO) 667 MG capsule Take 667 mg by mouth 3 (three) times daily with meals.      Marland Kitchen diltiazem (CARDIZEM CD) 240 MG 24 hr capsule Take 240 mg by mouth at bedtime.      . Multiple Vitamin (MULITIVITAMIN WITH MINERALS) TABS Take 1 tablet by mouth daily.      . Nebivolol HCl (BYSTOLIC) 20  MG TABS Take 1 tablet by mouth at bedtime.      . promethazine (PHENERGAN) 12.5 MG tablet Take 12.5 mg by mouth every 12 (twelve) hours as needed. For nausea      . RENVELA 800 MG tablet       . SENSIPAR 30 MG tablet       . traMADol (ULTRAM) 50 MG tablet TAKE 1 TABLET BY MOUTH EVERY 6 HOURS AS NEEDED FOR PAIN  60 tablet  0  . warfarin (COUMADIN) 4 MG tablet Take 1 tablet (4 mg total) by mouth daily.  35 tablet  3    Allergies  Aspirin; Avandia; Clonidine derivatives; Hctz; Lactose intolerance (gi); Lisinopril; and Tekturna  Electrocardiogram:  Assessment and Plan

## 2011-11-06 NOTE — Assessment & Plan Note (Signed)
F/U nephrology some oozing from fistula due to coumadin but otherwise dialysis going well

## 2011-11-06 NOTE — Assessment & Plan Note (Signed)
Good rate control and anticoagulation  

## 2011-11-06 NOTE — Patient Instructions (Signed)
Your physician wants you to follow-up in: YEAR WITH DR NISHAN  You will receive a reminder letter in the mail two months in advance. If you don't receive a letter, please call our office to schedule the follow-up appointment.  Your physician recommends that you continue on your current medications as directed. Please refer to the Current Medication list given to you today. 

## 2011-11-06 NOTE — Assessment & Plan Note (Signed)
Well controlled.  Continue current medications and low sodium Dash type diet.    

## 2011-11-06 NOTE — Addendum Note (Signed)
Addended by: ADAMS, KIMBERLY G on: 11/06/2011 09:53 AM   Modules accepted: Level of Service  

## 2011-11-20 ENCOUNTER — Ambulatory Visit (INDEPENDENT_AMBULATORY_CARE_PROVIDER_SITE_OTHER): Payer: Medicare Other | Admitting: *Deleted

## 2011-11-20 DIAGNOSIS — I4891 Unspecified atrial fibrillation: Secondary | ICD-10-CM

## 2011-11-20 DIAGNOSIS — Z7901 Long term (current) use of anticoagulants: Secondary | ICD-10-CM

## 2011-11-27 ENCOUNTER — Ambulatory Visit (INDEPENDENT_AMBULATORY_CARE_PROVIDER_SITE_OTHER): Payer: Medicare Other

## 2011-11-27 DIAGNOSIS — Z7901 Long term (current) use of anticoagulants: Secondary | ICD-10-CM

## 2011-11-27 DIAGNOSIS — I4891 Unspecified atrial fibrillation: Secondary | ICD-10-CM

## 2011-12-10 ENCOUNTER — Other Ambulatory Visit: Payer: Self-pay | Admitting: Family Medicine

## 2011-12-11 ENCOUNTER — Ambulatory Visit (INDEPENDENT_AMBULATORY_CARE_PROVIDER_SITE_OTHER): Payer: Medicare Other | Admitting: *Deleted

## 2011-12-11 DIAGNOSIS — Z7901 Long term (current) use of anticoagulants: Secondary | ICD-10-CM

## 2011-12-11 DIAGNOSIS — I4891 Unspecified atrial fibrillation: Secondary | ICD-10-CM

## 2011-12-11 LAB — POCT INR: INR: 1.6

## 2011-12-25 ENCOUNTER — Ambulatory Visit (INDEPENDENT_AMBULATORY_CARE_PROVIDER_SITE_OTHER): Payer: Medicare Other | Admitting: *Deleted

## 2011-12-25 DIAGNOSIS — I4891 Unspecified atrial fibrillation: Secondary | ICD-10-CM

## 2011-12-25 DIAGNOSIS — Z7901 Long term (current) use of anticoagulants: Secondary | ICD-10-CM

## 2011-12-25 NOTE — Patient Instructions (Signed)
Saline nose drops as directed prior to going to bed

## 2012-04-29 IMAGING — CR DG CHEST 2V
1 series · 1 of 1 positions shown · non-contrast
Comparison: [DATE] and 04/22/2011

CLINICAL DATA: Sepsis.  Positive blood culture.

CHEST - 2 VIEW

[w chest lat]
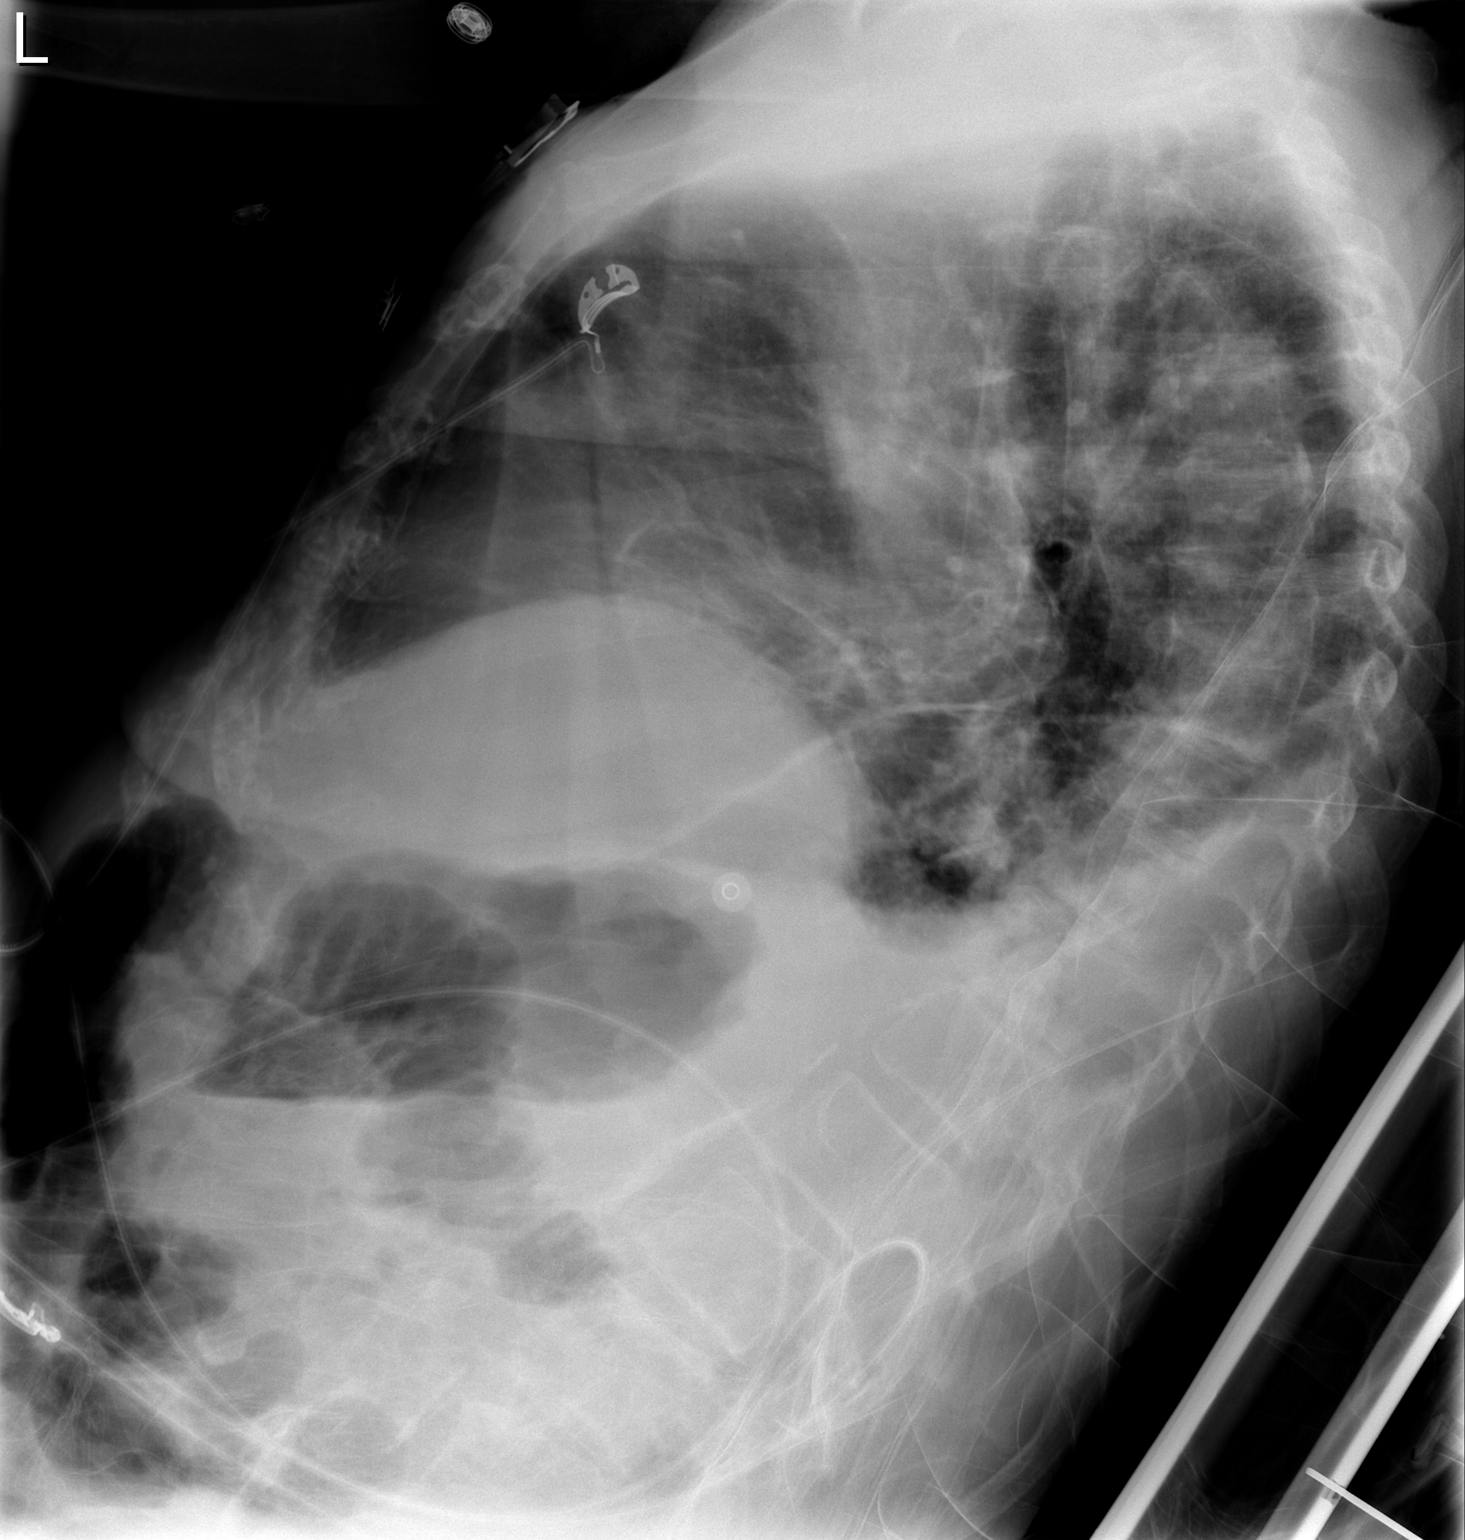

[1 of 1 positions shown; findings below may reference images not displayed]

FINDINGS: The patient has new small bilateral pleural effusions.
Marked cardiomegaly.  Pulmonary vascularity is normal.  No
consolidative infiltrates or effusions.  No acute osseous
abnormality.
IMPRESSION: New small bilateral effusions.  No pulmonary infiltrates.

## 2012-07-09 IMAGING — CR DG CHEST 2V
2 series · 2 of 2 positions shown · non-contrast
Comparison: 04/24/2011

CLINICAL DATA: Fever.

CHEST - 2 VIEW

[w chest lat]
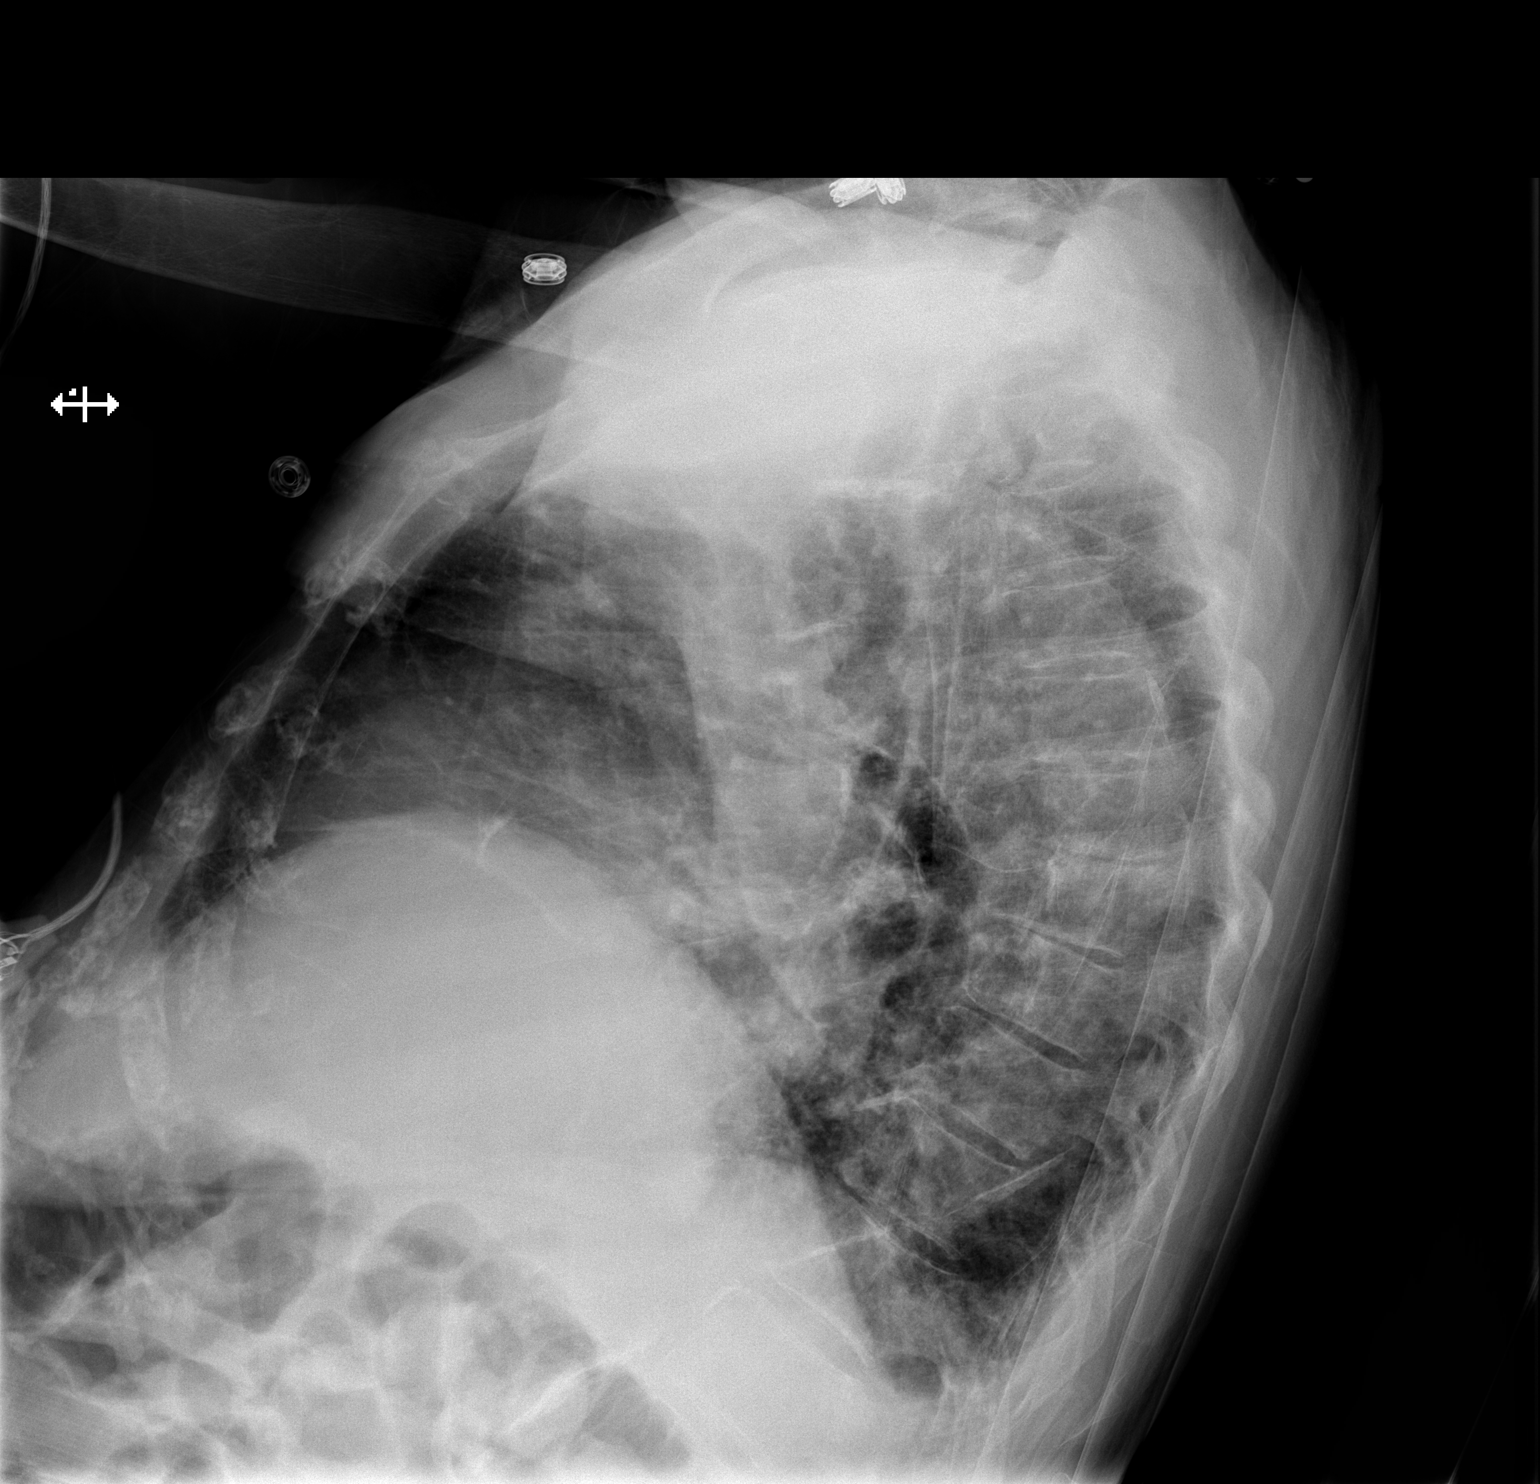

[x chest ap]
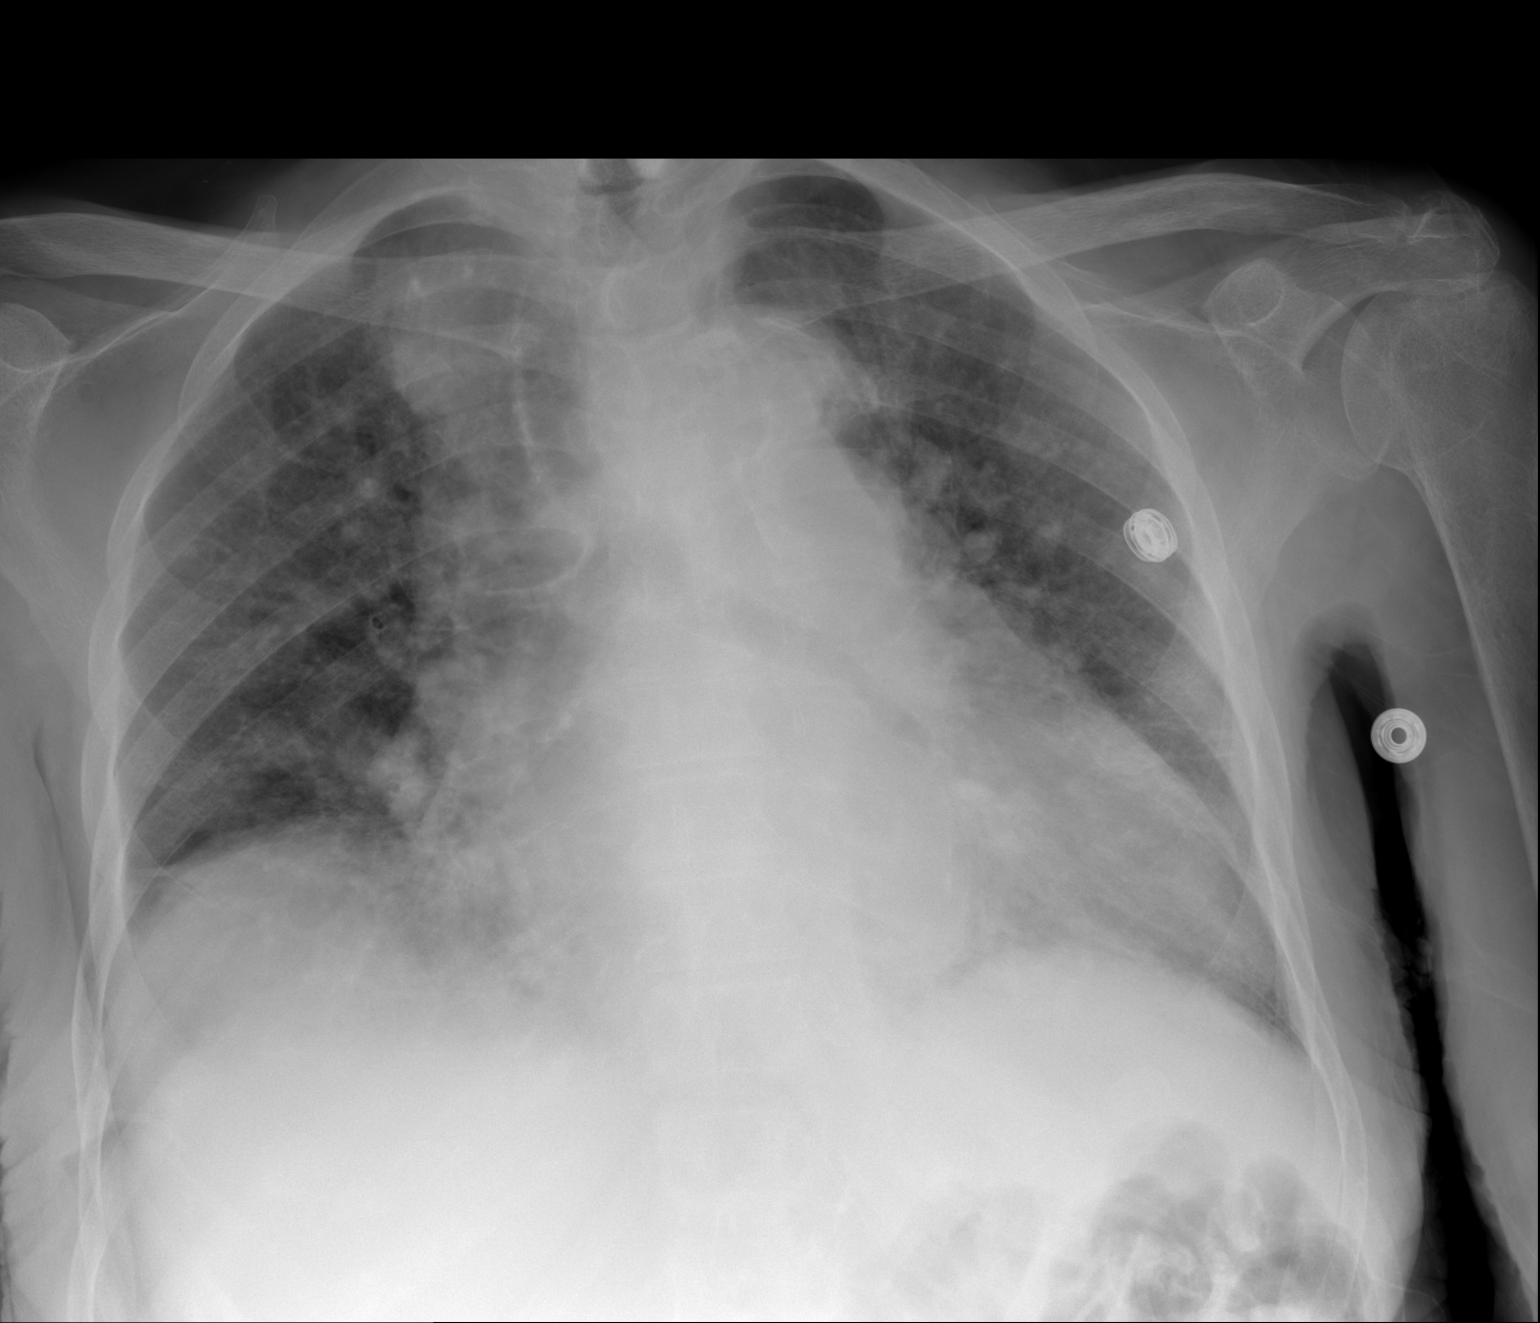

[2 of 2 positions shown; findings below may reference images not displayed]

FINDINGS: Stable cardiomegaly and tortuosity of the thoracic aorta
noted.  Rightward deviation of the trachea is present; right
paratracheal density could be due to ectatic vasculature or
possibly right upper lobe atelectasis.

Low lung volumes are present.  Indistinct pulmonary vasculatures
suggest pulmonary venous hypertension. Trace bilateral pleural
effusions are reduced in size compared the prior exam.

Linear subsegmental atelectasis or scarring noted at the right lung
base peripherally.  There is improved blunting of the left
costophrenic angle.
IMPRESSION: 1.  Cardiomegaly and pulmonary venous hypertension with trace
bilateral pleural effusions improved compared the prior exam
2.  Possible atelectasis medially in the right upper lobe.  The
rightward tracheal deviation could alternatively be from a left
side the thyroid goiter. This could be worked up with dedicated
chest CT if clinically warranted.
3.  Tortuous aorta.
4.  Subsegmental atelectasis or scarring peripherally in the right
lower lobe.

## 2013-04-22 ENCOUNTER — Ambulatory Visit: Payer: Self-pay | Admitting: Internal Medicine

## 2013-04-22 ENCOUNTER — Telehealth: Payer: Self-pay | Admitting: *Deleted

## 2013-04-22 DIAGNOSIS — I4891 Unspecified atrial fibrillation: Secondary | ICD-10-CM

## 2013-04-22 DIAGNOSIS — Z7901 Long term (current) use of anticoagulants: Secondary | ICD-10-CM

## 2013-04-22 NOTE — Telephone Encounter (Signed)
LMOM for grand-daughter or pt to call us back about f/u appt.

## 2015-01-11 DEATH — deceased
